# Patient Record
Sex: Female | Born: 1935 | Race: White | Hispanic: No | Smoking: Never smoker
Health system: Southern US, Community
[De-identification: ages and names within clinical notes are randomized; demographics above are authoritative.]

## PROBLEM LIST (undated history)

## (undated) DIAGNOSIS — E039 Hypothyroidism, unspecified: Secondary | ICD-10-CM

## (undated) DIAGNOSIS — F329 Major depressive disorder, single episode, unspecified: Secondary | ICD-10-CM

## (undated) DIAGNOSIS — I10 Essential (primary) hypertension: Secondary | ICD-10-CM

## (undated) DIAGNOSIS — F32A Depression, unspecified: Secondary | ICD-10-CM

## (undated) DIAGNOSIS — R001 Bradycardia, unspecified: Secondary | ICD-10-CM

## (undated) DIAGNOSIS — I4891 Unspecified atrial fibrillation: Secondary | ICD-10-CM

---
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
## 2004-11-15 ENCOUNTER — Ambulatory Visit: Payer: Self-pay | Admitting: Family Medicine

## 2005-09-17 ENCOUNTER — Ambulatory Visit: Payer: Self-pay | Admitting: Pain Medicine

## 2005-12-05 ENCOUNTER — Ambulatory Visit: Payer: Self-pay | Admitting: Pain Medicine

## 2006-01-02 ENCOUNTER — Ambulatory Visit: Payer: Self-pay | Admitting: Pain Medicine

## 2006-02-04 ENCOUNTER — Ambulatory Visit: Payer: Self-pay | Admitting: Physician Assistant

## 2006-02-12 ENCOUNTER — Ambulatory Visit: Payer: Self-pay | Admitting: Family Medicine

## 2006-02-19 ENCOUNTER — Ambulatory Visit: Payer: Self-pay | Admitting: Physician Assistant

## 2006-03-08 ENCOUNTER — Ambulatory Visit: Payer: Self-pay | Admitting: Physician Assistant

## 2006-04-08 ENCOUNTER — Ambulatory Visit: Payer: Self-pay | Admitting: Physician Assistant

## 2006-05-09 ENCOUNTER — Ambulatory Visit: Payer: Self-pay | Admitting: Physician Assistant

## 2006-06-06 ENCOUNTER — Ambulatory Visit: Payer: Self-pay | Admitting: Physician Assistant

## 2006-09-09 ENCOUNTER — Ambulatory Visit: Payer: Self-pay | Admitting: Pain Medicine

## 2006-10-08 ENCOUNTER — Ambulatory Visit: Payer: Self-pay | Admitting: Physician Assistant

## 2006-11-05 ENCOUNTER — Ambulatory Visit: Payer: Self-pay | Admitting: Physician Assistant

## 2006-12-05 ENCOUNTER — Ambulatory Visit: Payer: Self-pay | Admitting: Physician Assistant

## 2007-01-09 ENCOUNTER — Ambulatory Visit: Payer: Self-pay | Admitting: Physician Assistant

## 2007-02-06 ENCOUNTER — Ambulatory Visit: Payer: Self-pay | Admitting: Physician Assistant

## 2007-02-18 ENCOUNTER — Ambulatory Visit: Payer: Self-pay | Admitting: Family Medicine

## 2007-03-10 ENCOUNTER — Ambulatory Visit: Payer: Self-pay | Admitting: Physician Assistant

## 2007-04-08 ENCOUNTER — Ambulatory Visit: Payer: Self-pay | Admitting: Physician Assistant

## 2007-05-06 ENCOUNTER — Ambulatory Visit: Payer: Self-pay | Admitting: Physician Assistant

## 2007-06-04 ENCOUNTER — Ambulatory Visit: Payer: Self-pay | Admitting: Physician Assistant

## 2007-07-08 ENCOUNTER — Ambulatory Visit: Payer: Self-pay | Admitting: Physician Assistant

## 2007-08-13 ENCOUNTER — Ambulatory Visit: Payer: Self-pay | Admitting: Physician Assistant

## 2007-08-22 ENCOUNTER — Ambulatory Visit: Payer: Self-pay | Admitting: Family Medicine

## 2007-09-12 ENCOUNTER — Ambulatory Visit: Payer: Self-pay | Admitting: Physician Assistant

## 2007-10-16 ENCOUNTER — Ambulatory Visit: Payer: Self-pay | Admitting: Physician Assistant

## 2008-01-15 ENCOUNTER — Ambulatory Visit: Payer: Self-pay | Admitting: Physician Assistant

## 2008-02-19 ENCOUNTER — Ambulatory Visit: Payer: Self-pay | Admitting: Family Medicine

## 2008-03-11 ENCOUNTER — Ambulatory Visit: Payer: Self-pay | Admitting: Physician Assistant

## 2008-04-07 ENCOUNTER — Ambulatory Visit: Payer: Self-pay | Admitting: Pain Medicine

## 2008-04-12 ENCOUNTER — Ambulatory Visit: Payer: Self-pay | Admitting: Pain Medicine

## 2008-05-06 ENCOUNTER — Ambulatory Visit: Payer: Self-pay | Admitting: Physician Assistant

## 2008-06-23 ENCOUNTER — Ambulatory Visit: Payer: Self-pay | Admitting: Physician Assistant

## 2008-09-22 ENCOUNTER — Ambulatory Visit: Payer: Self-pay | Admitting: Physician Assistant

## 2008-11-17 ENCOUNTER — Ambulatory Visit: Payer: Self-pay | Admitting: Physician Assistant

## 2009-02-18 ENCOUNTER — Ambulatory Visit: Payer: Self-pay | Admitting: Pain Medicine

## 2009-02-22 ENCOUNTER — Ambulatory Visit: Payer: Self-pay | Admitting: Family Medicine

## 2009-03-28 ENCOUNTER — Ambulatory Visit: Payer: Self-pay | Admitting: Pain Medicine

## 2009-05-17 ENCOUNTER — Ambulatory Visit: Payer: Self-pay | Admitting: Physician Assistant

## 2009-07-15 ENCOUNTER — Encounter: Payer: Self-pay | Admitting: Neurology

## 2009-07-27 ENCOUNTER — Encounter: Payer: Self-pay | Admitting: Neurology

## 2009-08-16 ENCOUNTER — Ambulatory Visit: Payer: Self-pay | Admitting: Physician Assistant

## 2010-03-01 ENCOUNTER — Ambulatory Visit: Payer: Self-pay | Admitting: Family Medicine

## 2010-03-16 ENCOUNTER — Emergency Department: Payer: Self-pay | Admitting: Emergency Medicine

## 2010-04-12 ENCOUNTER — Ambulatory Visit: Payer: Self-pay | Admitting: Family Medicine

## 2010-09-26 ENCOUNTER — Emergency Department: Payer: Self-pay | Admitting: Emergency Medicine

## 2010-10-04 ENCOUNTER — Ambulatory Visit: Payer: Self-pay | Admitting: Ophthalmology

## 2010-12-13 ENCOUNTER — Ambulatory Visit: Payer: Self-pay | Admitting: Ophthalmology

## 2011-04-03 ENCOUNTER — Ambulatory Visit: Payer: Self-pay | Admitting: Family Medicine

## 2012-04-30 ENCOUNTER — Ambulatory Visit: Payer: Self-pay | Admitting: Internal Medicine

## 2012-08-12 IMAGING — CT CT HEAD WITHOUT CONTRAST
1 series · 16 of 30 positions shown, 20 images · non-contrast
Comparison: none

REASON FOR EXAM: new onset HA   poor sleep memory loss
COMMENTS:

[Series 2: soft tissue · axial · 0.38mm/px · z∈[-554,-418]mm · 16 of 30 slices shown, 20 images]
[im 2/30  brain]
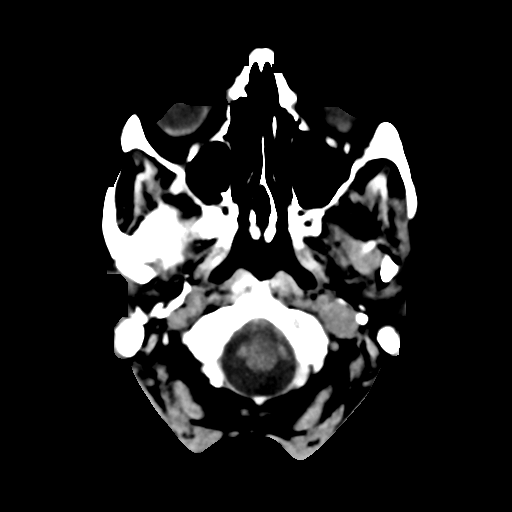
[im 2/30  bone]
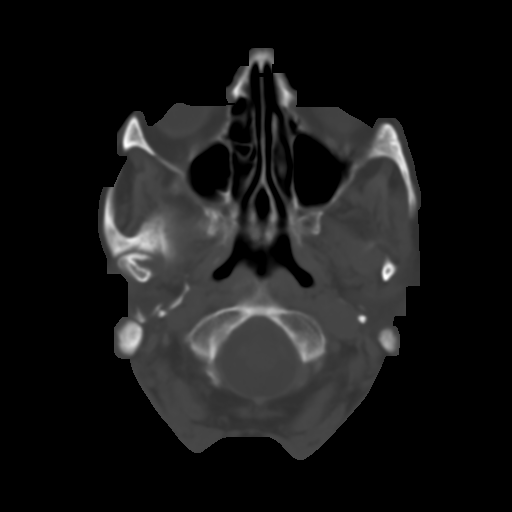
[im 4/30  brain]
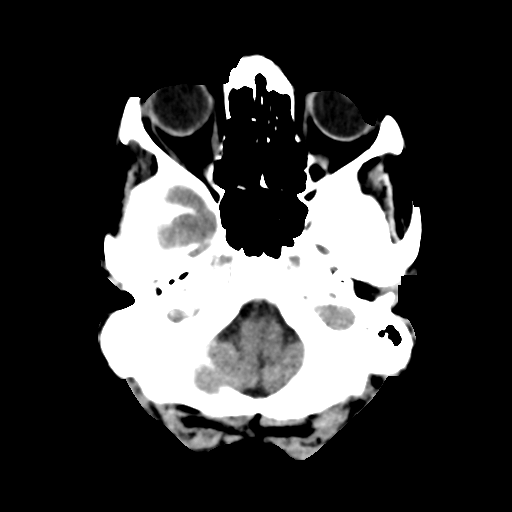
[im 6/30  brain]
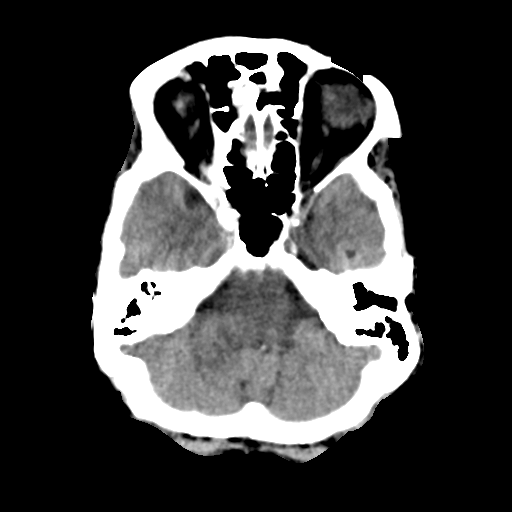
[im 8/30  brain]
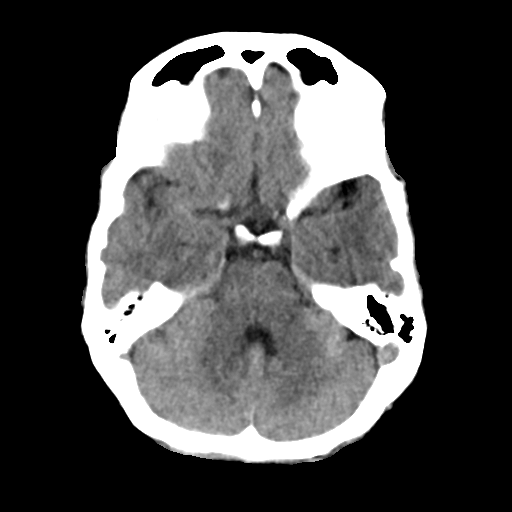
[im 9/30  brain]
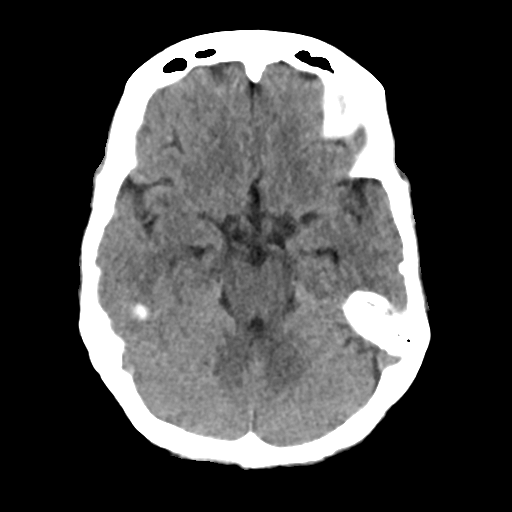
[im 9/30  bone]
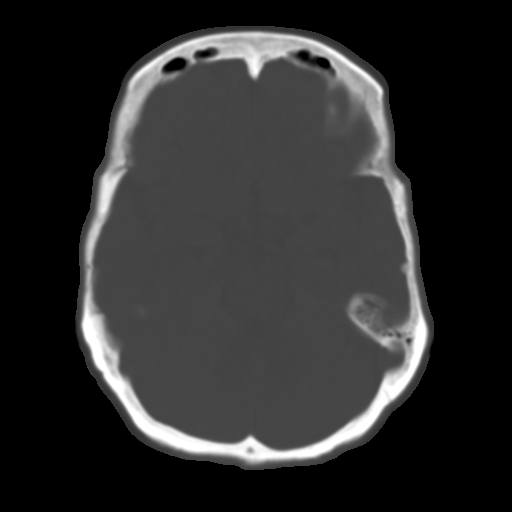
[im 11/30  brain]
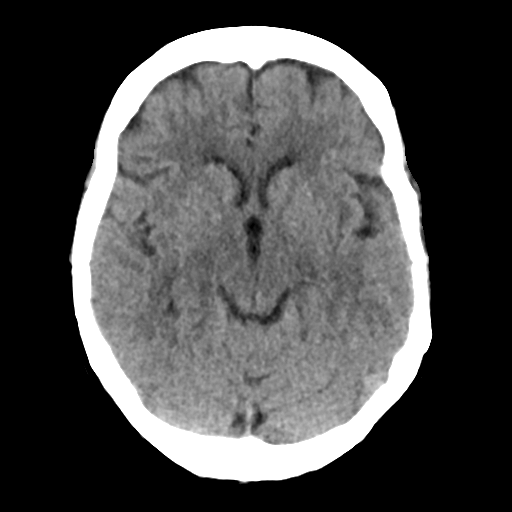
[im 13/30  brain]
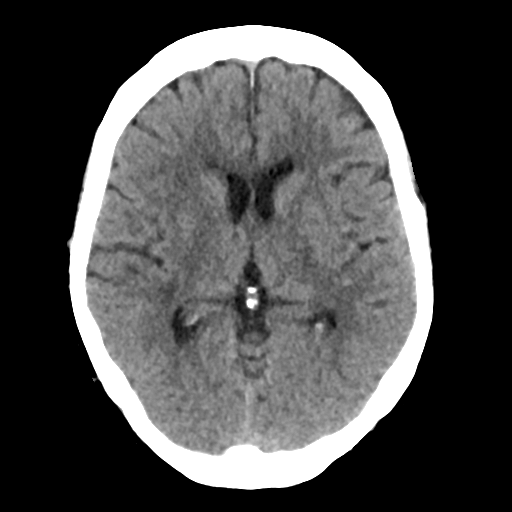
[im 15/30  brain]
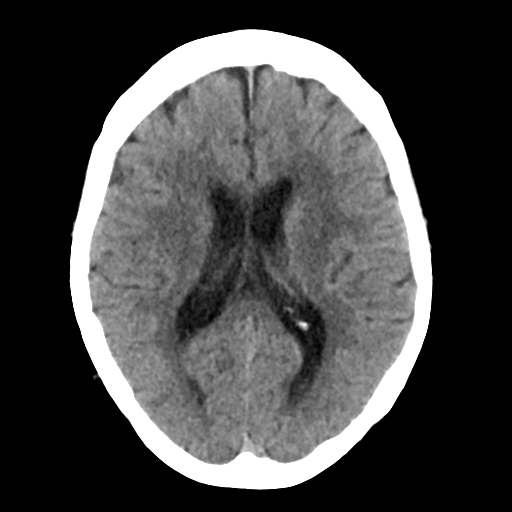
[im 16/30  brain]
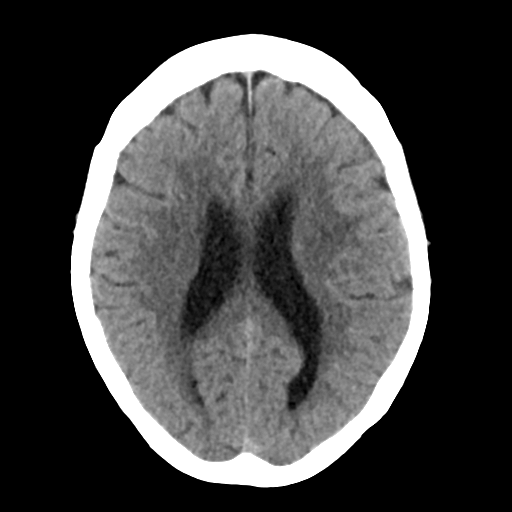
[im 16/30  bone]
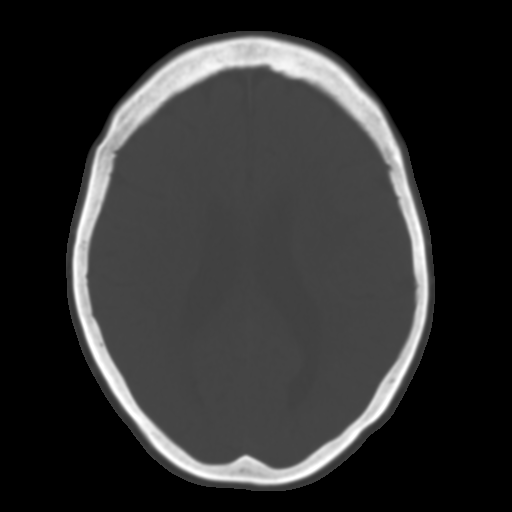
[im 18/30  brain]
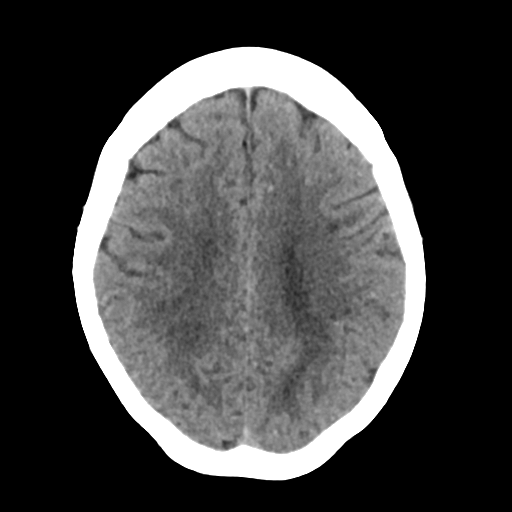
[im 20/30  brain]
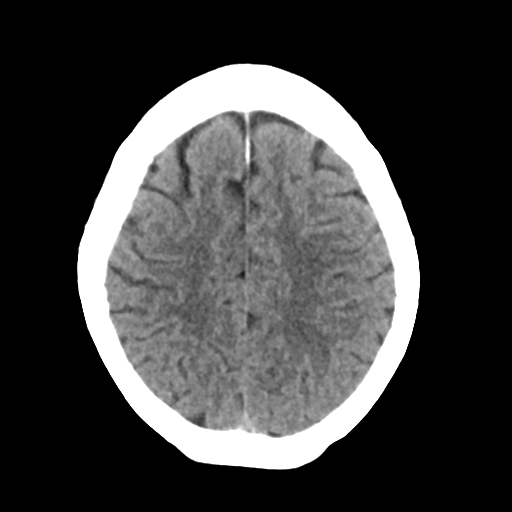
[im 22/30  brain]
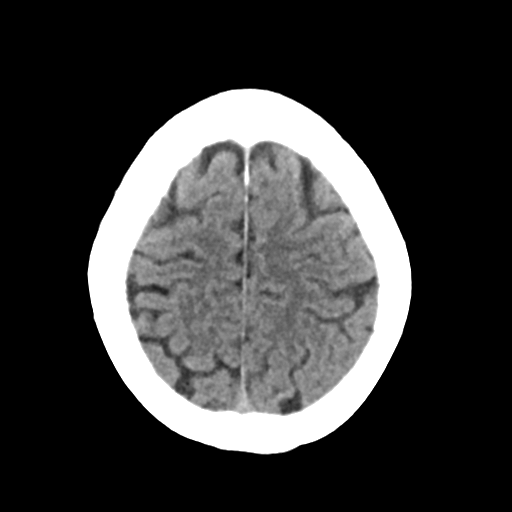
[im 23/30  brain]
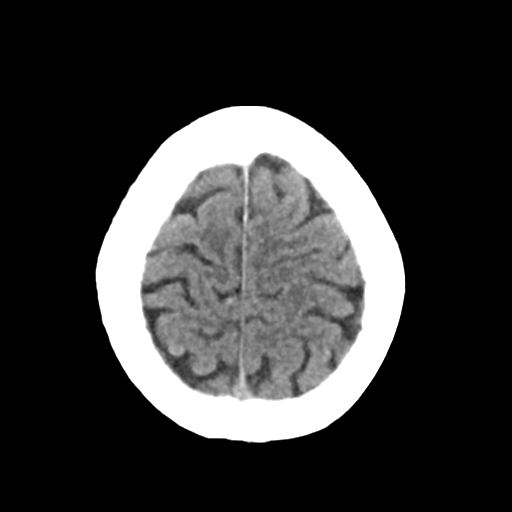
[im 23/30  bone]
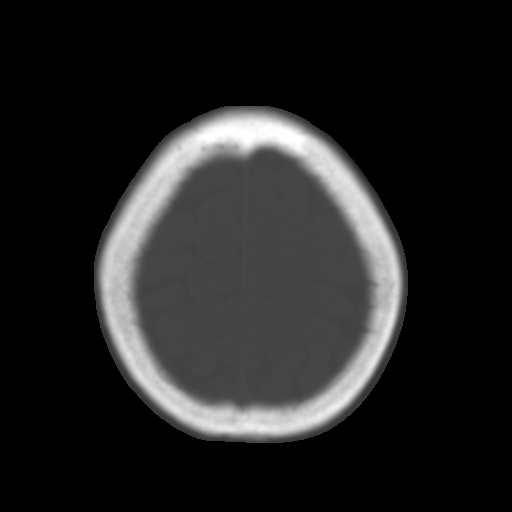
[im 25/30  brain]
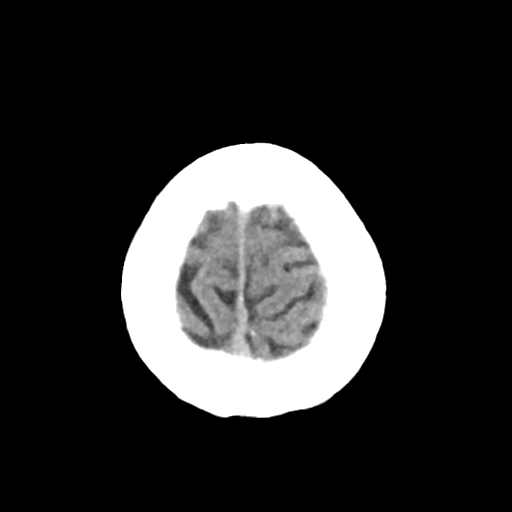
[im 27/30  brain]
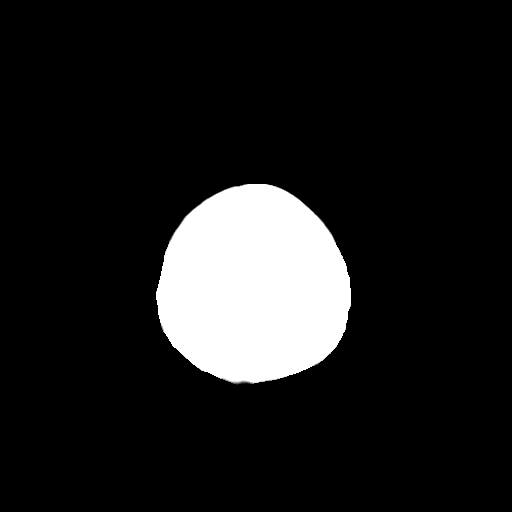
[im 29/30  brain]
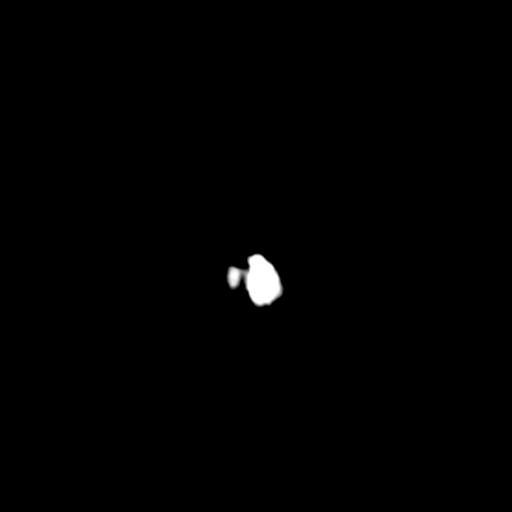

[16 of 30 positions shown; findings below may reference images not displayed]

PROCEDURE:     KCT - KCT HEAD WITHOUT CONTRAST  - April 12, 2010  [DATE]

RESULT:     Axial noncontrast CT scanning was performed through the brain at
5 mm intervals and slice thicknesses.

There are very mild age-appropriate atrophic changes diffusely. The
ventricles are normal in size and position. There is decreased density in
the deep white matter of both cerebral hemispheres consistent with chronic
small vessel ischemic type change. The cerebellum and brainstem are normal
in density. At bone window settings I do not see evidence of an acute skull
fracture. The observed portions of the paranasal sinuses and mastoid air
cells are clear.
IMPRESSION: I see no acute intracranial abnormality. If the patient's
symptoms persist and remain unexplained, followup MRI of the brain may be of
value.

## 2013-03-30 ENCOUNTER — Encounter: Payer: Self-pay | Admitting: Internal Medicine

## 2013-04-27 ENCOUNTER — Encounter: Payer: Self-pay | Admitting: Internal Medicine

## 2013-05-01 ENCOUNTER — Ambulatory Visit: Payer: Self-pay | Admitting: Internal Medicine

## 2015-09-29 ENCOUNTER — Other Ambulatory Visit: Payer: Self-pay | Admitting: Family

## 2015-09-29 DIAGNOSIS — Z0000 Encounter for general adult medical examination without abnormal findings: Secondary | ICD-10-CM

## 2015-11-02 ENCOUNTER — Ambulatory Visit: Admission: RE | Admit: 2015-11-02 | Payer: Self-pay | Source: Ambulatory Visit

## 2020-04-12 ENCOUNTER — Encounter: Payer: Self-pay | Admitting: Emergency Medicine

## 2020-04-12 ENCOUNTER — Other Ambulatory Visit: Payer: Self-pay

## 2020-04-12 ENCOUNTER — Emergency Department
Admission: EM | Admit: 2020-04-12 | Discharge: 2020-04-12 | Disposition: A | Discharge status: Home / Self Care | Payer: Medicare (Managed Care) | Attending: Emergency Medicine | Admitting: Emergency Medicine

## 2020-04-12 DIAGNOSIS — R310 Gross hematuria: Secondary | ICD-10-CM | POA: Diagnosis present

## 2020-04-12 DIAGNOSIS — E875 Hyperkalemia: Secondary | ICD-10-CM | POA: Diagnosis present

## 2020-04-12 DIAGNOSIS — K449 Diaphragmatic hernia without obstruction or gangrene: Secondary | ICD-10-CM | POA: Diagnosis present

## 2020-04-12 DIAGNOSIS — N132 Hydronephrosis with renal and ureteral calculous obstruction: Principal | ICD-10-CM | POA: Diagnosis present

## 2020-04-12 DIAGNOSIS — I160 Hypertensive urgency: Secondary | ICD-10-CM | POA: Diagnosis present

## 2020-04-12 DIAGNOSIS — N170 Acute kidney failure with tubular necrosis: Secondary | ICD-10-CM | POA: Diagnosis present

## 2020-04-12 DIAGNOSIS — D62 Acute posthemorrhagic anemia: Secondary | ICD-10-CM | POA: Diagnosis present

## 2020-04-12 DIAGNOSIS — Z66 Do not resuscitate: Secondary | ICD-10-CM | POA: Diagnosis present

## 2020-04-12 DIAGNOSIS — Z20822 Contact with and (suspected) exposure to covid-19: Secondary | ICD-10-CM | POA: Diagnosis present

## 2020-04-12 DIAGNOSIS — E876 Hypokalemia: Secondary | ICD-10-CM | POA: Diagnosis present

## 2020-04-12 DIAGNOSIS — R42 Dizziness and giddiness: Secondary | ICD-10-CM | POA: Insufficient documentation

## 2020-04-12 DIAGNOSIS — Z7901 Long term (current) use of anticoagulants: Secondary | ICD-10-CM

## 2020-04-12 DIAGNOSIS — Z515 Encounter for palliative care: Secondary | ICD-10-CM | POA: Diagnosis present

## 2020-04-12 DIAGNOSIS — N1330 Unspecified hydronephrosis: Secondary | ICD-10-CM | POA: Diagnosis not present

## 2020-04-12 DIAGNOSIS — Z5321 Procedure and treatment not carried out due to patient leaving prior to being seen by health care provider: Secondary | ICD-10-CM | POA: Insufficient documentation

## 2020-04-12 DIAGNOSIS — E039 Hypothyroidism, unspecified: Secondary | ICD-10-CM | POA: Diagnosis present

## 2020-04-12 DIAGNOSIS — C679 Malignant neoplasm of bladder, unspecified: Secondary | ICD-10-CM | POA: Diagnosis present

## 2020-04-12 DIAGNOSIS — I480 Paroxysmal atrial fibrillation: Secondary | ICD-10-CM | POA: Diagnosis present

## 2020-04-12 DIAGNOSIS — I10 Essential (primary) hypertension: Secondary | ICD-10-CM | POA: Diagnosis present

## 2020-04-12 DIAGNOSIS — E860 Dehydration: Secondary | ICD-10-CM | POA: Diagnosis present

## 2020-04-12 DIAGNOSIS — D6859 Other primary thrombophilia: Secondary | ICD-10-CM | POA: Diagnosis present

## 2020-04-12 HISTORY — DX: Unspecified atrial fibrillation: I48.91

## 2020-04-12 HISTORY — DX: Hypothyroidism, unspecified: E03.9

## 2020-04-12 HISTORY — DX: Depression, unspecified: F32.A

## 2020-04-12 HISTORY — DX: Bradycardia, unspecified: R00.1

## 2020-04-12 HISTORY — DX: Essential (primary) hypertension: I10

## 2020-04-12 LAB — BASIC METABOLIC PANEL
Anion gap: 14 (ref 5–15)
BUN: 52 mg/dL — ABNORMAL HIGH (ref 8–23)
CO2: 25 mmol/L (ref 22–32)
Calcium: 9.8 mg/dL (ref 8.9–10.3)
Chloride: 100 mmol/L (ref 98–111)
Creatinine, Ser: 4.81 mg/dL — ABNORMAL HIGH (ref 0.44–1.00)
GFR calc Af Amer: 9 mL/min — ABNORMAL LOW (ref >60)
GFR calc non Af Amer: 8 mL/min — ABNORMAL LOW (ref >60)
Glucose, Bld: 125 mg/dL — ABNORMAL HIGH (ref 70–99)
Potassium: 5.1 mmol/L (ref 3.5–5.1)
Sodium: 139 mmol/L (ref 135–145)

## 2020-04-12 LAB — CBC
HCT: 28.4 % — ABNORMAL LOW (ref 36.0–46.0)
Hemoglobin: 8.9 g/dL — ABNORMAL LOW (ref 12.0–15.0)
MCH: 33.1 pg (ref 26.0–34.0)
MCHC: 31.3 g/dL (ref 30.0–36.0)
MCV: 105.6 fL — ABNORMAL HIGH (ref 80.0–100.0)
Platelets: 305 10*3/uL (ref 150–400)
RBC: 2.69 MIL/uL — ABNORMAL LOW (ref 3.87–5.11)
RDW: 14.3 % (ref 11.5–15.5)
WBC: 8.9 10*3/uL (ref 4.0–10.5)
nRBC: 0 % (ref 0.0–0.2)

## 2020-04-12 NOTE — ED Notes (Signed)
Discussed with dr Corky Downs, no repeat labs

## 2020-04-12 NOTE — ED Triage Notes (Signed)
Pt returned with son. Per son they spoke with dr at [facility] and they told her to come back for blood transfusion.  hgb 8.9

## 2020-04-12 NOTE — ED Triage Notes (Signed)
Pt was here and left earlier.  [facility] doctor told pt she needs a blood transfusion which is why pt returned. Repeat labs not done.

## 2020-04-12 NOTE — ED Notes (Signed)
Pt leaving with son, does not want to wait.

## 2020-04-12 NOTE — ED Triage Notes (Signed)
[facility] arrives [facility] for reports of feeling dizzy. When asking [facility] employee is pt can tell me what is going on with her she states, "yes". When asked pt why she is here she states she was dizzy this morning. Pt in NAD, speech clear, skin warm and dry. Pt has pink MOST form

## 2020-04-13 ENCOUNTER — Inpatient Hospital Stay
Admission: EM | Admit: 2020-04-13 | Discharge: 2020-04-25 | DRG: 669 | Disposition: A | Discharge status: Skilled Nursing Facility | Payer: Medicare (Managed Care) | Source: Skilled Nursing Facility | Attending: Family Medicine | Admitting: Family Medicine

## 2020-04-13 ENCOUNTER — Emergency Department: Payer: Medicare (Managed Care)

## 2020-04-13 DIAGNOSIS — Z66 Do not resuscitate: Secondary | ICD-10-CM

## 2020-04-13 DIAGNOSIS — I4891 Unspecified atrial fibrillation: Secondary | ICD-10-CM | POA: Diagnosis not present

## 2020-04-13 DIAGNOSIS — N1330 Unspecified hydronephrosis: Secondary | ICD-10-CM | POA: Diagnosis present

## 2020-04-13 DIAGNOSIS — E860 Dehydration: Secondary | ICD-10-CM | POA: Diagnosis present

## 2020-04-13 DIAGNOSIS — E875 Hyperkalemia: Secondary | ICD-10-CM

## 2020-04-13 DIAGNOSIS — N3289 Other specified disorders of bladder: Secondary | ICD-10-CM

## 2020-04-13 DIAGNOSIS — N132 Hydronephrosis with renal and ureteral calculous obstruction: Secondary | ICD-10-CM | POA: Diagnosis present

## 2020-04-13 DIAGNOSIS — Z20822 Contact with and (suspected) exposure to covid-19: Secondary | ICD-10-CM | POA: Diagnosis present

## 2020-04-13 DIAGNOSIS — I160 Hypertensive urgency: Secondary | ICD-10-CM | POA: Diagnosis present

## 2020-04-13 DIAGNOSIS — D6859 Other primary thrombophilia: Secondary | ICD-10-CM | POA: Diagnosis present

## 2020-04-13 DIAGNOSIS — D494 Neoplasm of unspecified behavior of bladder: Secondary | ICD-10-CM | POA: Diagnosis not present

## 2020-04-13 DIAGNOSIS — Z515 Encounter for palliative care: Secondary | ICD-10-CM

## 2020-04-13 DIAGNOSIS — I480 Paroxysmal atrial fibrillation: Secondary | ICD-10-CM | POA: Diagnosis present

## 2020-04-13 DIAGNOSIS — Z7901 Long term (current) use of anticoagulants: Secondary | ICD-10-CM | POA: Diagnosis not present

## 2020-04-13 DIAGNOSIS — N170 Acute kidney failure with tubular necrosis: Secondary | ICD-10-CM | POA: Diagnosis present

## 2020-04-13 DIAGNOSIS — D62 Acute posthemorrhagic anemia: Secondary | ICD-10-CM

## 2020-04-13 DIAGNOSIS — K449 Diaphragmatic hernia without obstruction or gangrene: Secondary | ICD-10-CM | POA: Diagnosis present

## 2020-04-13 DIAGNOSIS — C679 Malignant neoplasm of bladder, unspecified: Secondary | ICD-10-CM | POA: Diagnosis present

## 2020-04-13 DIAGNOSIS — R319 Hematuria, unspecified: Secondary | ICD-10-CM

## 2020-04-13 DIAGNOSIS — E876 Hypokalemia: Secondary | ICD-10-CM | POA: Diagnosis present

## 2020-04-13 DIAGNOSIS — Z7189 Other specified counseling: Secondary | ICD-10-CM

## 2020-04-13 DIAGNOSIS — D6869 Other thrombophilia: Secondary | ICD-10-CM | POA: Diagnosis not present

## 2020-04-13 DIAGNOSIS — I10 Essential (primary) hypertension: Secondary | ICD-10-CM | POA: Diagnosis present

## 2020-04-13 DIAGNOSIS — N179 Acute kidney failure, unspecified: Secondary | ICD-10-CM | POA: Diagnosis not present

## 2020-04-13 DIAGNOSIS — E039 Hypothyroidism, unspecified: Secondary | ICD-10-CM | POA: Diagnosis present

## 2020-04-13 DIAGNOSIS — R310 Gross hematuria: Secondary | ICD-10-CM | POA: Diagnosis present

## 2020-04-13 DIAGNOSIS — K5900 Constipation, unspecified: Secondary | ICD-10-CM | POA: Insufficient documentation

## 2020-04-13 LAB — CBC WITH DIFFERENTIAL/PLATELET
Abs Immature Granulocytes: 0.03 10*3/uL (ref 0.00–0.07)
Basophils Absolute: 0.1 10*3/uL (ref 0.0–0.1)
Basophils Relative: 1 %
Eosinophils Absolute: 0.3 10*3/uL (ref 0.0–0.5)
Eosinophils Relative: 3 %
HCT: 27.5 % — ABNORMAL LOW (ref 36.0–46.0)
Hemoglobin: 9.1 g/dL — ABNORMAL LOW (ref 12.0–15.0)
Immature Granulocytes: 0 %
Lymphocytes Relative: 15 %
Lymphs Abs: 1.6 10*3/uL (ref 0.7–4.0)
MCH: 34 pg (ref 26.0–34.0)
MCHC: 33.1 g/dL (ref 30.0–36.0)
MCV: 102.6 fL — ABNORMAL HIGH (ref 80.0–100.0)
Monocytes Absolute: 0.9 10*3/uL (ref 0.1–1.0)
Monocytes Relative: 8 %
Neutro Abs: 8 10*3/uL — ABNORMAL HIGH (ref 1.7–7.7)
Neutrophils Relative %: 73 %
Platelets: 314 10*3/uL (ref 150–400)
RBC: 2.68 MIL/uL — ABNORMAL LOW (ref 3.87–5.11)
RDW: 14.3 % (ref 11.5–15.5)
WBC: 10.8 10*3/uL — ABNORMAL HIGH (ref 4.0–10.5)
nRBC: 0 % (ref 0.0–0.2)

## 2020-04-13 LAB — COMPREHENSIVE METABOLIC PANEL
ALT: 11 U/L (ref 0–44)
AST: 20 U/L (ref 15–41)
Albumin: 4.1 g/dL (ref 3.5–5.0)
Alkaline Phosphatase: 72 U/L (ref 38–126)
Anion gap: 14 (ref 5–15)
BUN: 52 mg/dL — ABNORMAL HIGH (ref 8–23)
CO2: 24 mmol/L (ref 22–32)
Calcium: 10.5 mg/dL — ABNORMAL HIGH (ref 8.9–10.3)
Chloride: 101 mmol/L (ref 98–111)
Creatinine, Ser: 4.47 mg/dL — ABNORMAL HIGH (ref 0.44–1.00)
GFR calc Af Amer: 10 mL/min — ABNORMAL LOW (ref >60)
GFR calc non Af Amer: 8 mL/min — ABNORMAL LOW (ref >60)
Glucose, Bld: 135 mg/dL — ABNORMAL HIGH (ref 70–99)
Potassium: 4.2 mmol/L (ref 3.5–5.1)
Sodium: 139 mmol/L (ref 135–145)
Total Bilirubin: 0.6 mg/dL (ref 0.3–1.2)
Total Protein: 7.3 g/dL (ref 6.5–8.1)

## 2020-04-13 LAB — URINALYSIS, COMPLETE (UACMP) WITH MICROSCOPIC
Bilirubin Urine: NEGATIVE
Glucose, UA: NEGATIVE mg/dL
Ketones, ur: NEGATIVE mg/dL
Nitrite: NEGATIVE
Protein, ur: 30 mg/dL — AB
Specific Gravity, Urine: 1.009 (ref 1.005–1.030)
pH: 6 (ref 5.0–8.0)

## 2020-04-13 LAB — VITAMIN D 25 HYDROXY (VIT D DEFICIENCY, FRACTURES): Vit D, 25-Hydroxy: 63.7 ng/mL (ref 30–100)

## 2020-04-13 LAB — TROPONIN I (HIGH SENSITIVITY)
Troponin I (High Sensitivity): 13 ng/L (ref <18)
Troponin I (High Sensitivity): 16 ng/L (ref <18)

## 2020-04-13 LAB — HEMOGLOBIN AND HEMATOCRIT, BLOOD
HCT: 24.7 % — ABNORMAL LOW (ref 36.0–46.0)
HCT: 28.5 % — ABNORMAL LOW (ref 36.0–46.0)
Hemoglobin: 7.7 g/dL — ABNORMAL LOW (ref 12.0–15.0)
Hemoglobin: 9.1 g/dL — ABNORMAL LOW (ref 12.0–15.0)

## 2020-04-13 LAB — SARS CORONAVIRUS 2 BY RT PCR (HOSPITAL ORDER, PERFORMED IN ~~LOC~~ HOSPITAL LAB): SARS Coronavirus 2: NEGATIVE

## 2020-04-13 MED ORDER — POLYVINYL ALCOHOL 1.4 % OP SOLN
1.0000 [drp] | Freq: Two times a day (BID) | OPHTHALMIC | Status: DC | PRN
  Filled 2020-04-13: qty 15

## 2020-04-13 MED ORDER — BISACODYL 10 MG RE SUPP
10.0000 mg | Freq: Every day | RECTAL | Status: DC
  Administered 2020-04-13: 10 mg via RECTAL
  Filled 2020-04-13 (×4): qty 1

## 2020-04-13 MED ORDER — METOPROLOL TARTRATE 5 MG/5ML IV SOLN
2.5000 mg | Freq: Four times a day (QID) | INTRAVENOUS | Status: DC | PRN
  Administered 2020-04-13: 2.5 mg via INTRAVENOUS
  Filled 2020-04-13: qty 5

## 2020-04-13 MED ORDER — TRAZODONE HCL 50 MG PO TABS
25.0000 mg | ORAL_TABLET | Freq: Every evening | ORAL | Status: DC | PRN
  Filled 2020-04-13: qty 0.5

## 2020-04-13 MED ORDER — HYDRALAZINE HCL 50 MG PO TABS
100.0000 mg | ORAL_TABLET | Freq: Three times a day (TID) | ORAL | Status: DC
  Administered 2020-04-13 – 2020-04-16 (×8): 100 mg via ORAL
  Filled 2020-04-13 (×9): qty 2

## 2020-04-13 MED ORDER — FLEET ENEMA 7-19 GM/118ML RE ENEM
1.0000 | ENEMA | Freq: Every day | RECTAL | Status: DC
  Administered 2020-04-15: 1 via RECTAL

## 2020-04-13 MED ORDER — ONDANSETRON HCL 4 MG PO TABS: 4.0000 mg | ORAL_TABLET | Freq: Four times a day (QID) | ORAL | Status: DC | PRN

## 2020-04-13 MED ORDER — ONDANSETRON HCL 4 MG/2ML IJ SOLN
4.0000 mg | Freq: Four times a day (QID) | INTRAMUSCULAR | Status: DC | PRN
  Administered 2020-04-13 – 2020-04-15 (×2): 4 mg via INTRAVENOUS
  Filled 2020-04-13 (×2): qty 2

## 2020-04-13 MED ORDER — CITALOPRAM HYDROBROMIDE 20 MG PO TABS
20.0000 mg | ORAL_TABLET | Freq: Every day | ORAL | Status: DC
  Administered 2020-04-13 – 2020-04-25 (×12): 20 mg via ORAL
  Filled 2020-04-13 (×13): qty 1

## 2020-04-13 MED ORDER — PANTOPRAZOLE SODIUM 40 MG PO TBEC
40.0000 mg | DELAYED_RELEASE_TABLET | Freq: Two times a day (BID) | ORAL | Status: DC
  Administered 2020-04-13 – 2020-04-25 (×23): 40 mg via ORAL
  Filled 2020-04-13 (×24): qty 1

## 2020-04-13 MED ORDER — SODIUM CHLORIDE 0.9 % IV BOLUS
500.0000 mL | Freq: Once | INTRAVENOUS | Status: AC
  Administered 2020-04-13: 500 mL via INTRAVENOUS

## 2020-04-13 MED ORDER — TRAZODONE HCL 100 MG PO TABS
100.0000 mg | ORAL_TABLET | Freq: Every day | ORAL | Status: DC
  Administered 2020-04-13 – 2020-04-24 (×12): 100 mg via ORAL
  Filled 2020-04-13 (×13): qty 1

## 2020-04-13 MED ORDER — SODIUM CHLORIDE 0.9 % IV SOLN
1.0000 g | INTRAVENOUS | Status: DC
  Administered 2020-04-13 – 2020-04-14 (×2): 1 g via INTRAVENOUS
  Filled 2020-04-13: qty 10
  Filled 2020-04-13: qty 1
  Filled 2020-04-13: qty 10

## 2020-04-13 MED ORDER — ACETAMINOPHEN 650 MG RE SUPP: 650.0000 mg | Freq: Four times a day (QID) | RECTAL | Status: DC | PRN

## 2020-04-13 MED ORDER — AMLODIPINE BESYLATE 5 MG PO TABS
5.0000 mg | ORAL_TABLET | Freq: Every day | ORAL | Status: DC
  Administered 2020-04-13 – 2020-04-16 (×3): 5 mg via ORAL
  Filled 2020-04-13 (×4): qty 1

## 2020-04-13 MED ORDER — SODIUM CHLORIDE 0.9 % IV SOLN: INTRAVENOUS | Status: DC

## 2020-04-13 MED ORDER — LABETALOL HCL 5 MG/ML IV SOLN: 20.0000 mg | INTRAVENOUS | Status: DC | PRN

## 2020-04-13 MED ORDER — TIMOLOL MALEATE 0.5 % OP SOLN
1.0000 [drp] | Freq: Every morning | OPHTHALMIC | Status: DC
  Administered 2020-04-16 – 2020-04-25 (×10): 1 [drp] via OPHTHALMIC
  Filled 2020-04-13 (×2): qty 5

## 2020-04-13 MED ORDER — MAGNESIUM HYDROXIDE 400 MG/5ML PO SUSP
30.0000 mL | Freq: Every day | ORAL | Status: DC | PRN
  Filled 2020-04-13: qty 30

## 2020-04-13 MED ORDER — ACETAMINOPHEN 325 MG PO TABS
650.0000 mg | ORAL_TABLET | Freq: Four times a day (QID) | ORAL | Status: DC | PRN
  Administered 2020-04-21: 650 mg via ORAL
  Filled 2020-04-13: qty 2

## 2020-04-13 MED ORDER — HYDRALAZINE HCL 20 MG/ML IJ SOLN
10.0000 mg | Freq: Once | INTRAMUSCULAR | Status: AC
  Administered 2020-04-13: 10 mg via INTRAVENOUS
  Filled 2020-04-13: qty 1

## 2020-04-13 MED ORDER — LIDOCAINE 5 % EX PTCH
1.0000 | MEDICATED_PATCH | CUTANEOUS | Status: DC
  Administered 2020-04-14 – 2020-04-23 (×10): 1 via TRANSDERMAL
  Filled 2020-04-13 (×13): qty 1

## 2020-04-13 MED ORDER — FAMOTIDINE 20 MG PO TABS
10.0000 mg | ORAL_TABLET | Freq: Every day | ORAL | Status: DC
  Administered 2020-04-13 – 2020-04-24 (×11): 10 mg via ORAL
  Filled 2020-04-13 (×11): qty 1

## 2020-04-13 MED ORDER — LACTULOSE 10 GM/15ML PO SOLN
30.0000 g | Freq: Every day | ORAL | Status: DC
  Administered 2020-04-13 – 2020-04-16 (×2): 30 g via ORAL
  Filled 2020-04-13 (×4): qty 60

## 2020-04-13 MED ORDER — LATANOPROST 0.005 % OP SOLN
1.0000 [drp] | Freq: Every day | OPHTHALMIC | Status: DC
  Administered 2020-04-15 – 2020-04-24 (×9): 1 [drp] via OPHTHALMIC
  Filled 2020-04-13 (×2): qty 2.5

## 2020-04-13 NOTE — ED Notes (Signed)
Pt in room with bed alarm going off attempted to get out of bed. Pt unable to safely get up on own and is reported to be unable to redirect. Engineer, building services, [place], [place],  at bedside at this time

## 2020-04-13 NOTE — ED Provider Notes (Signed)
[facility] Emergency Department Provider Note   ____________________________________________   First MD Initiated Contact with Patient 04/13/20 0201     (approximate)  I have reviewed the triage vital signs and the nursing notes.   HISTORY  Chief Complaint Abnormal Lab    HPI Christy Mccann is a 86 y.o. female sent [facility] the ED by [facility] for acute renal failure seen on her lab work.  Patient was started on Eliquis for atrial fibrillation several months ago.  Has had some issues with hematuria since.  On repeat blood work today, her BUN and creatinine were noted to be significantly elevated which is a change from normal.  Patient denies fever, cough, chest pain, shortness of breath, abdominal pain, nausea or vomiting.  Still makes urine daily.      Past Medical History:  Diagnosis Date  . A-fib ([place])   . Bradycardia   . Depression   . Hypertension   . Hypothyroid     Patient Active Problem List   Diagnosis Date Noted  . Acute kidney injury (AKI) with acute tubular necrosis (ATN) ([place]) 04/13/2020    History reviewed. No pertinent surgical history.  Prior to Admission medications   Medication Sig Start Date End Date Taking? Authorizing Provider  XARELTO 2.5 MG TABS tablet Take 5 mg by mouth daily. 02/20/20   [provider]    Allergies Gabapentin  History reviewed. No pertinent family history.  Social History Social History   Tobacco Use  . Smoking status: Not on file  Substance Use Topics  . Alcohol use: Not on file  . Drug use: Not on file    Review of Systems  Constitutional: No fever/chills Eyes: No visual changes. ENT: No sore throat. Cardiovascular: Denies chest pain. Respiratory: Denies shortness of breath. Gastrointestinal: No abdominal pain.  No nausea, no vomiting.  No diarrhea.  No constipation. Genitourinary: Positive for occasional hematuria.  Negative for dysuria. Musculoskeletal: Negative for back  pain. Skin: Negative for rash. Neurological: Negative for headaches, focal weakness or numbness.   ____________________________________________   PHYSICAL EXAM:  VITAL SIGNS: ED Triage Vitals  Enc Vitals Group     BP 04/12/20 1823 (!) 161/105     Pulse Rate 04/12/20 1823 (!) 107     Resp 04/12/20 1823 16     Temp 04/12/20 1823 98.9 F (37.2 C)     Temp Source 04/12/20 1823 Oral     SpO2 04/12/20 1823 100 %     Weight 04/12/20 1817 100 lb (45.4 kg)     Height --      Head Circumference --      Peak Flow --      Pain Score 04/12/20 1817 0     Pain Loc --      Pain Edu? --      Excl. in [place]? --     Constitutional: Alert and oriented.  Elderly appearing and in no acute distress. Eyes: Conjunctivae are normal. PERRL. EOMI. Head: Atraumatic. Nose: No congestion/rhinnorhea. Mouth/Throat: Mucous membranes are mildly dry.   Neck: No stridor.   Cardiovascular: Normal rate, regular rhythm. Grossly normal heart sounds.  Good peripheral circulation. Respiratory: Normal respiratory effort.  No retractions. Lungs CTAB. Gastrointestinal: Soft and nontender to light or deep palpation. No distention. No abdominal bruits. No CVA tenderness. Musculoskeletal: No lower extremity tenderness nor edema.  No joint effusions. Neurologic:  Normal speech and language. No gross focal neurologic deficits are appreciated.  Skin:  Skin is  warm, dry and intact. No rash noted. Psychiatric: Mood and affect are normal. Speech and behavior are normal.  ____________________________________________   LABS (all labs ordered are listed, but only abnormal results are displayed)  Labs Reviewed  CBC WITH DIFFERENTIAL/PLATELET - Abnormal; Notable for the following components:      Result Value   WBC 10.8 (*)    RBC 2.68 (*)    Hemoglobin 9.1 (*)    HCT 27.5 (*)    MCV 102.6 (*)    Neutro Abs 8.0 (*)    All other components within normal limits  COMPREHENSIVE METABOLIC PANEL - Abnormal; Notable for the  following components:   Glucose, Bld 135 (*)    BUN 52 (*)    Creatinine, Ser 4.47 (*)    Calcium 10.5 (*)    GFR calc non Af Amer 8 (*)    GFR calc Af Amer 10 (*)    All other components within normal limits  URINALYSIS, COMPLETE (UACMP) WITH MICROSCOPIC - Abnormal; Notable for the following components:   Color, Urine YELLOW (*)    APPearance CLEAR (*)    Hgb urine dipstick MODERATE (*)    Protein, ur 30 (*)    Leukocytes,Ua TRACE (*)    Bacteria, UA RARE (*)    All other components within normal limits  SARS CORONAVIRUS 2 BY RT PCR (HOSPITAL ORDER, [place] [facility])  URINE CULTURE  TROPONIN I (HIGH SENSITIVITY)  TROPONIN I (HIGH SENSITIVITY)   ____________________________________________  EKG  ED ECG REPORT I, Nussen Pullin J, the attending physician, personally viewed and interpreted this ECG.   Date: 04/13/2020  EKG Time: 1556  Rate: 115  Rhythm: atrial fibrillation, rate 115  Axis: Normal  Intervals:none  ST&T Change: Nonspecific  ____________________________________________  RADIOLOGY  ED MD interpretation: No acute cardiopulmonary process; large hiatal hernia; marked bilateral hydroureteronephrosis  Official radiology report(s): DG Chest Port 1 View  Result Date: 04/13/2020 CLINICAL DATA:  Acute renal failure EXAM: PORTABLE CHEST 1 VIEW COMPARISON:  Radiograph 10/28/2013 (report only) FINDINGS: Hyperinflation of the lungs, reportedly chronic. No focal consolidative opacity is seen. No convincing features of edema. No pneumothorax or visible effusion. Large hiatal hernia is present. Dense material along the right mediastinal border may reflect material within the esophagus or calcified hilar nodes. Remaining cardiomediastinal contours are unremarkable accounting for positioning secondary to marked levocurvature of the lumbar spine with severe multilevel discogenic changes and additional degenerative changes in the shoulders. IMPRESSION: 1. No acute  cardiopulmonary disease. 2. Large hiatal hernia. Density along the right mediastinal border may reflect material within the esophagus or calcified hilar nodes. 3. Hyperinflation, likely emphysematous change. Electronically Signed   By: Lovena Le M.D.   On: 04/13/2020 02:24   CT Renal Stone Study  Result Date: 04/13/2020 CLINICAL DATA:  Acute renal failure. EXAM: CT ABDOMEN AND PELVIS WITHOUT CONTRAST TECHNIQUE: Multidetector CT imaging of the abdomen and pelvis was performed following the standard protocol without IV contrast. COMPARISON:  None. FINDINGS: Lower chest: No consolidation or pleural fluid. Moderate to large hiatal hernia with greater than 50% of the stomach intrathoracic. Hepatobiliary: Focal hepatic abnormality on noncontrast exam. Gallbladder minimally distended. No calcified gallstone. No biliary dilatation. Pancreas: Mild parenchymal atrophy. No ductal dilatation or inflammation. Spleen: Normal in size without focal abnormality. Adrenals/Urinary Tract: No adrenal nodule. There is marked bilateral hydroureteronephrosis. Thinning of bilateral renal parenchyma. No significant perinephric edema. Both ureters are dilated to the urinary bladder insertion. Bladder is essentially empty with equivocal  circumferential wall thickening. No renal calculi. Stomach/Bowel: Moderately large hiatal hernia with greater than 50% of the stomach intrathoracic. No evidence of small bowel obstruction or inflammation. The appendix is not definitively visualized. Moderate volume of stool throughout the colon. There is colonic redundancy and tortuosity. Distal descending and sigmoid colon are nondistended, however there is stool distending the rectum with rectal distention of 6.6 cm. No rectal or colonic wall thickening. Vascular/Lymphatic: Aortic atherosclerosis and tortuosity. No aortic aneurysm. Limited assessment for adenopathy in the absence of contrast and paucity of intra-abdominal fat. No bulky enlarged  abdominopelvic lymph nodes. Reproductive: The uterus is not visualized, may be surgically absent or atrophic. No evidence of adnexal mass. Other: Few scattered calcifications in the pelvis may be phleboliths or calcified nodes. There is no ascites. No free air. Musculoskeletal: Degenerative change of the pubic symphysis. Scoliosis and multilevel degenerative change in the spine. IMPRESSION: 1. Marked bilateral hydroureteronephrosis. Both ureters are dilated to the urinary bladder insertion. There is thinning of the bilateral renal parenchyma. Lack of perinephric edema suggests this may be a chronic process. 2. Decompressed bladder with equivocal circumferential wall thickening. 3. Moderate to large hiatal hernia with greater than 50% of the stomach intrathoracic. 4. Moderate volume of stool throughout the colon with colonic redundancy and tortuosity, suggesting constipation. Rectal distention of 6.6 cm, recommend correlation for fecal impaction. Aortic Atherosclerosis (ICD10-I70.0). Electronically Signed   By: Keith Rake M.D.   On: 04/13/2020 03:42    ____________________________________________   PROCEDURES  Procedure(s) performed (including Critical Care):  Procedures   ____________________________________________   INITIAL IMPRESSION / ASSESSMENT AND PLAN / ED COURSE  As part of my medical decision making, I reviewed the following data within the [place] notes reviewed and incorporated, Labs reviewed, EKG interpreted, Old chart reviewed, Radiograph reviewed, Discussed with admitting physician and Notes from prior ED visits     KARYN BRULL was evaluated in Emergency Department on 04/13/2020 for the symptoms described in the history of present illness. She was evaluated in the context of the global COVID-19 pandemic, which necessitated consideration that the patient might be at risk for infection with the SARS-CoV-2 virus that causes COVID-19. Institutional  protocols and algorithms that pertain to the evaluation of patients at risk for COVID-19 are in a state of rapid change based on information released by regulatory bodies including the CDC and federal and state organizations. These policies and algorithms were followed during the patient's care in the ED.   86 [facility].  Patient has had intermittent hematuria since starting Eliquis for new onset atrial fibrillation.  Differential diagnosis includes but is not limited to dehydration, medication effect, infectious, metabolic etiologies, etc.  I have personally reviewed patient's records which were sent by her PCP.  Will obtain repeat lab work as patient will require admission, CT renal colic study.  Will begin gentle IV hydration, hydralazine for hypertension.   Clinical Course as of Apr 13 453  Wed Apr 13, 2020  0453 Updated patient of CT results and discuss with hospital services for admission. Trace leukocytes with 6-10 WBC on urinalysis; will add culture.   [JS]    Clinical Course User Index [JS] Paulette Blanch, MD     ____________________________________________   FINAL CLINICAL IMPRESSION(S) / ED DIAGNOSES  Final diagnoses:  Acute renal failure, unspecified acute renal failure type [facility])  Hydroureteronephrosis     ED Discharge Orders    None  Note:  This document was prepared using Dragon voice recognition software and may include unintentional dictation errors.   Paulette Blanch, MD 04/13/20 [phone]

## 2020-04-13 NOTE — Progress Notes (Signed)
Patient ID: Christy Mccann, female   DOB: 30-Mar-1931, 86 y.o.   MRN: [id number] [facility] PROGRESS NOTE  AMADI YOSHINO EPP:[id number] DOB: 03/27/1931 DOA: 04/13/2020 PCP: [place]  HPI/Subjective: Patient feeling okay.  She states that she had a bowel movement this morning.  She was sent in for abnormal labs.  CT scan showing bilateral hydronephrosis.  Patient states that she is urinating okay.  Patient states that she seen blood in the urine  Objective: Vitals:   04/13/20 0128 04/13/20 0700  BP: (!) 191/87 (!) 157/100  Pulse: 92 96  Resp: 18 16  Temp: 98.9 F (37.2 C)   SpO2: 98% 100%   No intake or output data in the 24 hours ending 04/13/20 1331 Filed Weights   04/12/20 1817  Weight: 45.4 kg    ROS: Review of Systems  Respiratory: Negative for shortness of breath.   Cardiovascular: Negative for chest pain.  Gastrointestinal: Negative for abdominal pain.  Genitourinary: Positive for hematuria.   Exam: Physical Exam HENT:     Head: Normocephalic.     Nose: No mucosal edema.     Mouth/Throat:     Pharynx: No oropharyngeal exudate.  Eyes:     General: Lids are normal.     Conjunctiva/sclera: Conjunctivae normal.     Pupils: Pupils are equal, round, and reactive to light.  Cardiovascular:     Rate and Rhythm: Normal rate and regular rhythm.     Heart sounds: Normal heart sounds, S1 normal and S2 normal.  Pulmonary:     Breath sounds: No decreased breath sounds, wheezing or rales.  Abdominal:     Palpations: Abdomen is soft.     Tenderness: There is no abdominal tenderness.  Musculoskeletal:     Right lower leg: No swelling.     Left lower leg: No swelling.  Skin:    General: Skin is warm.     Findings: No rash.  Neurological:     Mental Status: She is alert.     Cranial Nerves: Cranial nerves are intact.       Data Reviewed: Basic Metabolic Panel: Recent Labs  Lab 04/12/20 1608 04/13/20 0223  NA 139 139  K 5.1 4.2  CL 100 101   CO2 25 24  GLUCOSE 125* 135*  BUN 52* 52*  CREATININE 4.81* 4.47*  CALCIUM 9.8 10.5*   Liver Function Tests: Recent Labs  Lab 04/13/20 0223  AST 20  ALT 11  ALKPHOS 72  BILITOT 0.6  PROT 7.3  ALBUMIN 4.1   CBC: Recent Labs  Lab 04/12/20 1608 04/13/20 0223 04/13/20 0714  WBC 8.9 10.8*  --   NEUTROABS  --  8.0*  --   HGB 8.9* 9.1* 7.7*  HCT 28.4* 27.5* 24.7*  MCV 105.6* 102.6*  --   PLT 305 314  --      Recent Results (from the past 240 hour(s))  SARS Coronavirus 2 by RT PCR (hospital order, performed in [facility] hospital lab) Nasopharyngeal Nasopharyngeal Swab     Status: None   Collection Time: 04/13/20  2:23 AM   Specimen: Nasopharyngeal Swab  Result Value Ref Range Status   SARS Coronavirus 2 NEGATIVE NEGATIVE Final    Comment: (NOTE) SARS-CoV-2 target nucleic acids are NOT DETECTED.  The SARS-CoV-2 RNA is generally detectable in upper and lower respiratory specimens during the acute phase of infection. The lowest concentration of SARS-CoV-2 viral copies this assay can detect is 250 copies / mL. A negative  result does not preclude SARS-CoV-2 infection and should not be used as the sole basis for treatment or other patient management decisions.  A negative result may occur with improper specimen collection / handling, submission of specimen other than nasopharyngeal swab, presence of viral mutation(s) within the areas targeted by this assay, and inadequate number of viral copies (<250 copies / mL). A negative result must be combined with clinical observations, patient history, and epidemiological information.  Fact Sheet for Patients:   StrictlyIdeas.no  Fact Sheet for Healthcare Providers: BankingDealers.co.za  This test is not yet approved or  cleared by the [place] FDA and has been authorized for detection and/or diagnosis of SARS-CoV-2 by FDA under an Emergency Use Authorization (EUA).  This EUA  will remain in effect (meaning this test can be used) for the duration of the COVID-19 declaration under Section 564(b)(1) of the Act, 21 U.S.C. section 360bbb-3(b)(1), unless the authorization is terminated or revoked sooner.  Performed at [facility], [address] [place], [place] [address]      Studies: DG Chest [place] 1 View  Result Date: 04/13/2020 CLINICAL DATA:  Acute renal failure EXAM: PORTABLE CHEST 1 VIEW COMPARISON:  Radiograph 10/28/2013 (report only) FINDINGS: Hyperinflation of the lungs, reportedly chronic. No focal consolidative opacity is seen. No convincing features of edema. No pneumothorax or visible effusion. Large hiatal hernia is present. Dense material along the right mediastinal border may reflect material within the esophagus or calcified hilar nodes. Remaining cardiomediastinal contours are unremarkable accounting for positioning secondary to marked levocurvature of the lumbar spine with severe multilevel discogenic changes and additional degenerative changes in the shoulders. IMPRESSION: 1. No acute cardiopulmonary disease. 2. Large hiatal hernia. Density along the right mediastinal border may reflect material within the esophagus or calcified hilar nodes. 3. Hyperinflation, likely emphysematous change. Electronically Signed   By: Lovena Le M.D.   On: 04/13/2020 02:24   CT Renal Stone Study  Result Date: 04/13/2020 CLINICAL DATA:  Acute renal failure. EXAM: CT ABDOMEN AND PELVIS WITHOUT CONTRAST TECHNIQUE: Multidetector CT imaging of the abdomen and pelvis was performed following the standard protocol without IV contrast. COMPARISON:  None. FINDINGS: Lower chest: No consolidation or pleural fluid. Moderate to large hiatal hernia with greater than 50% of the stomach intrathoracic. Hepatobiliary: Focal hepatic abnormality on noncontrast exam. Gallbladder minimally distended. No calcified gallstone. No biliary dilatation. Pancreas: Mild parenchymal atrophy.  No ductal dilatation or inflammation. Spleen: Normal in size without focal abnormality. Adrenals/Urinary Tract: No adrenal nodule. There is marked bilateral hydroureteronephrosis. Thinning of bilateral renal parenchyma. No significant perinephric edema. Both ureters are dilated to the urinary bladder insertion. Bladder is essentially empty with equivocal circumferential wall thickening. No renal calculi. Stomach/Bowel: Moderately large hiatal hernia with greater than 50% of the stomach intrathoracic. No evidence of small bowel obstruction or inflammation. The appendix is not definitively visualized. Moderate volume of stool throughout the colon. There is colonic redundancy and tortuosity. Distal descending and sigmoid colon are nondistended, however there is stool distending the rectum with rectal distention of 6.6 cm. No rectal or colonic wall thickening. Vascular/Lymphatic: Aortic atherosclerosis and tortuosity. No aortic aneurysm. Limited assessment for adenopathy in the absence of contrast and paucity of intra-abdominal fat. No bulky enlarged abdominopelvic lymph nodes. Reproductive: The uterus is not visualized, may be surgically absent or atrophic. No evidence of adnexal mass. Other: Few scattered calcifications in the pelvis may be phleboliths or calcified nodes. There is no ascites. No free air. Musculoskeletal: Degenerative change of the  pubic symphysis. Scoliosis and multilevel degenerative change in the spine. IMPRESSION: 1. Marked bilateral hydroureteronephrosis. Both ureters are dilated to the urinary bladder insertion. There is thinning of the bilateral renal parenchyma. Lack of perinephric edema suggests this may be a chronic process. 2. Decompressed bladder with equivocal circumferential wall thickening. 3. Moderate to large hiatal hernia with greater than 50% of the stomach intrathoracic. 4. Moderate volume of stool throughout the colon with colonic redundancy and tortuosity, suggesting  constipation. Rectal distention of 6.6 cm, recommend correlation for fecal impaction. Aortic Atherosclerosis (ICD10-I70.0). Electronically Signed   By: Keith Rake M.D.   On: 04/13/2020 03:42    Scheduled Meds: . bisacodyl  10 mg Rectal Daily  . lactulose  30 g Oral Daily  . sodium phosphate  1 enema Rectal QHS   Continuous Infusions: . sodium chloride 30 mL/hr at 04/13/20 1249  . cefTRIAXone (ROCEPHIN)  IV      Assessment/Plan:  1. Acute kidney injury with bilateral hydronephrosis on CT scan.  Gentle IV fluid hydration.  Continue to monitor closely.  Case discussed with urology considering a cystoscopy with stenting tomorrow. 2. Acute blood loss anemia with hematuria.  Decrease rate of IV fluids.  Serial hemoglobins.  Transfuse if less than 7.  Benefits and risk of blood transfusion explained to patient.  Holding Xarelto. 3. Essential hypertension start Norvasc. 4. Urinalysis positive.  Continue IV fluids.  And Rocephin and follow-up urine culture. 5. Cognitive issues as per son.  Check a TSH, B12. 6. Slight hypercalcemia likely with dehydration and acute kidney injury.  Check a PTH.  Check a PTH related protein, vitamin D and serum protein electrophoresis. 7. Constipation seen on CT scan will give lactulose Dulcolax suppository and Fleet enema. 8. Large hiatal hernia seen on CT scan.  Will give Pepcid.        Code Status:     Code Status Orders  (From admission, onward)         Start     Ordered   04/13/20 0617  Full code  Continuous        04/13/20 0623        Code Status History    This patient has a current code status but no historical code status.   Advance Care Planning Activity     Family Communication: Spoke with son Almyra Free on the phone at [phone] Disposition Plan: Status is: Inpatient  Dispo: The patient is from: Home              Anticipated d/c is to: Home              Anticipated d/c date is: Likely will need at least 4 days here in the  hospital              Patient currently being treated for acute kidney injury and bilateral hydronephrosis and acute blood loss anemia  Consultants:  Urology  Antibiotics:  Rocephin  Time spent: 32 minutes  [place]

## 2020-04-13 NOTE — H&P (Signed)
[place]   PATIENT NAME: Christy Mccann    MR#:  [id number]  DATE OF BIRTH:  [birth year]  DATE OF ADMISSION:  04/13/2020  PRIMARY CARE PHYSICIAN: [place]   REQUESTING/REFERRING PHYSICIAN: Lurline Hare, MD  CHIEF COMPLAINT:   Chief Complaint  Patient presents with  . Abnormal Lab    HISTORY OF PRESENT ILLNESS:  Thora Scherman  is a 86 y.o. slim Caucasian female with a known history of atrial fibrillation, depression, hypertension and hypothyroidism, who presented to the emergency room with acute onset of hematuria which has been intermittent over the last week.  The patient has been on Eliquis for several months for her atrial fibrillation.  She has been having occasional mild fatigue and tiredness.  She denies any abdominal or flank pain.  No fever or chills.  She has been having headache without dizziness or blurred vision.  No chest pain or dyspnea or cough or wheezing or hemoptysis.  No other bleeding diathesis.  Upon presentation to the emergency room, blood pressure was 157/102 with a heart rate of 108 and otherwise normal vital signs.  Blood pressure later on was 169/122.  Labs revealed a BUN of 52 and creatinine 4.81 with a potassium of 5.1 yesterday and today BUN was 52 with a creatinine of 4.47 potassium 4.2 and CBC showed hemoglobin of 8.9 hematocrit 28.4 and today hemoglobin is 9.1 hematocrit 27.1 with macrocytosis on both occasions.  Urinalysis showed 6-10 WBCs and 11-20 RBCs with rare bacteria. EKG showed atrial fibrillation with rapid ventricular rate spines 115.  Portable chest ray showed no acute cardiopulmonary disease.  It showed large hiatal hernia and density along the right mediastinal border that may reflect material within the esophagus or calcified hilar lymph nodes.  It showed hyperinflation likely emphysematous changes.  CT renal stone revealed the following: 1. Marked bilateral hydroureteronephrosis. Both ureters are dilated to the urinary  bladder insertion. There is thinning of the bilateral renal parenchyma. Lack of perinephric edema suggests this may be a chronic process. 2. Decompressed bladder with equivocal circumferential wall thickening. 3. Moderate to large hiatal hernia with greater than 50% of the stomach intrathoracic. 4. Moderate volume of stool throughout the colon with colonic redundancy and tortuosity, suggesting constipation. Rectal distention of 6.6 cm, recommend correlation for fecal impaction.  Noncontrasted head CT scan revealed no acute intracranial normalities.  MRI of the lumbar revealed spondylosis with no other acute abnormalities.  The patient will be admitted to a progressive unit bed for further evaluation and management.  PAST MEDICAL HISTORY:   Past Medical History:  Diagnosis Date  . A-fib ([place])   . Bradycardia   . Depression   . Hypertension   . Hypothyroid     PAST SURGICAL HISTORY:  History reviewed. No pertinent surgical history.  She did not recall any previous surgeries  SOCIAL HISTORY:   Social History   Tobacco Use  . Smoking status: Not on file  Substance Use Topics  . Alcohol use: Not on file    FAMILY HISTORY:  History reviewed. No pertinent family history.  She does not recall familial diseases  DRUG ALLERGIES:   Allergies  Allergen Reactions  . Gabapentin Other (See Comments)    Unknown effects    REVIEW OF SYSTEMS:   ROS As per history of present illness. All pertinent systems were reviewed above. Constitutional, HEENT, cardiovascular, respiratory, GI, GU, musculoskeletal, neuro, psychiatric, endocrine, integumentary and hematologic systems were reviewed and are otherwise negative/unremarkable  except for positive findings mentioned above in the HPI.   MEDICATIONS AT HOME:   Prior to Admission medications   Medication Sig Start Date End Date Taking? Authorizing Provider  XARELTO 2.5 MG TABS tablet Take 5 mg by mouth daily. 02/20/20   [provider]      VITAL SIGNS:  Blood pressure (!) 191/87, pulse 92, temperature 98.9 F (37.2 C), temperature source Oral, resp. rate 18, weight 45.4 kg, SpO2 98 %.  PHYSICAL EXAMINATION:  Physical Exam  GENERAL:  86 y.o.-year-old Caucasian female patient lying in the bed with no acute distress.  EYES: Pupils equal, round, reactive to light and accommodation. No scleral icterus. Extraocular muscles intact.  HEENT: Head atraumatic, normocephalic. Oropharynx and nasopharynx clear.  NECK:  Supple, no jugular venous distention. No thyroid enlargement, no tenderness.  LUNGS: Normal breath sounds bilaterally, no wheezing, rales,rhonchi or crepitation. No use of accessory muscles of respiration.  CARDIOVASCULAR: Regular rate and rhythm, S1, S2 normal. No murmurs, rubs, or gallops.  ABDOMEN: Soft, nondistended, nontender. Bowel sounds present. No organomegaly or mass.  EXTREMITIES: No pedal edema, cyanosis, or clubbing.  NEUROLOGIC: Cranial nerves II through XII are intact. Muscle strength 5/5 in all extremities. Sensation intact. Gait not checked.  PSYCHIATRIC: The patient is alert and oriented x 3.  Normal affect and good eye contact. SKIN: No obvious rash, lesion, or ulcer.   LABORATORY PANEL:   CBC Recent Labs  Lab 04/13/20 0223  WBC 10.8*  HGB 9.1*  HCT 27.5*  PLT 314   ------------------------------------------------------------------------------------------------------------------  Chemistries  Recent Labs  Lab 04/13/20 0223  NA 139  K 4.2  CL 101  CO2 24  GLUCOSE 135*  BUN 52*  CREATININE 4.47*  CALCIUM 10.5*  AST 20  ALT 11  ALKPHOS 72  BILITOT 0.6   ------------------------------------------------------------------------------------------------------------------  Cardiac Enzymes No results for input(s): TROPONINI in the last 168  hours. ------------------------------------------------------------------------------------------------------------------  RADIOLOGY:  DG Chest Port 1 View  Result Date: 04/13/2020 CLINICAL DATA:  Acute renal failure EXAM: PORTABLE CHEST 1 VIEW COMPARISON:  Radiograph 10/28/2013 (report only) FINDINGS: Hyperinflation of the lungs, reportedly chronic. No focal consolidative opacity is seen. No convincing features of edema. No pneumothorax or visible effusion. Large hiatal hernia is present. Dense material along the right mediastinal border may reflect material within the esophagus or calcified hilar nodes. Remaining cardiomediastinal contours are unremarkable accounting for positioning secondary to marked levocurvature of the lumbar spine with severe multilevel discogenic changes and additional degenerative changes in the shoulders. IMPRESSION: 1. No acute cardiopulmonary disease. 2. Large hiatal hernia. Density along the right mediastinal border may reflect material within the esophagus or calcified hilar nodes. 3. Hyperinflation, likely emphysematous change. Electronically Signed   By: Lovena Le M.D.   On: 04/13/2020 02:24   CT Renal Stone Study  Result Date: 04/13/2020 CLINICAL DATA:  Acute renal failure. EXAM: CT ABDOMEN AND PELVIS WITHOUT CONTRAST TECHNIQUE: Multidetector CT imaging of the abdomen and pelvis was performed following the standard protocol without IV contrast. COMPARISON:  None. FINDINGS: Lower chest: No consolidation or pleural fluid. Moderate to large hiatal hernia with greater than 50% of the stomach intrathoracic. Hepatobiliary: Focal hepatic abnormality on noncontrast exam. Gallbladder minimally distended. No calcified gallstone. No biliary dilatation. Pancreas: Mild parenchymal atrophy. No ductal dilatation or inflammation. Spleen: Normal in size without focal abnormality. Adrenals/Urinary Tract: No adrenal nodule. There is marked bilateral hydroureteronephrosis. Thinning of  bilateral renal parenchyma. No significant perinephric edema. Both ureters are dilated to the urinary bladder  insertion. Bladder is essentially empty with equivocal circumferential wall thickening. No renal calculi. Stomach/Bowel: Moderately large hiatal hernia with greater than 50% of the stomach intrathoracic. No evidence of small bowel obstruction or inflammation. The appendix is not definitively visualized. Moderate volume of stool throughout the colon. There is colonic redundancy and tortuosity. Distal descending and sigmoid colon are nondistended, however there is stool distending the rectum with rectal distention of 6.6 cm. No rectal or colonic wall thickening. Vascular/Lymphatic: Aortic atherosclerosis and tortuosity. No aortic aneurysm. Limited assessment for adenopathy in the absence of contrast and paucity of intra-abdominal fat. No bulky enlarged abdominopelvic lymph nodes. Reproductive: The uterus is not visualized, may be surgically absent or atrophic. No evidence of adnexal mass. Other: Few scattered calcifications in the pelvis may be phleboliths or calcified nodes. There is no ascites. No free air. Musculoskeletal: Degenerative change of the pubic symphysis. Scoliosis and multilevel degenerative change in the spine. IMPRESSION: 1. Marked bilateral hydroureteronephrosis. Both ureters are dilated to the urinary bladder insertion. There is thinning of the bilateral renal parenchyma. Lack of perinephric edema suggests this may be a chronic process. 2. Decompressed bladder with equivocal circumferential wall thickening. 3. Moderate to large hiatal hernia with greater than 50% of the stomach intrathoracic. 4. Moderate volume of stool throughout the colon with colonic redundancy and tortuosity, suggesting constipation. Rectal distention of 6.6 cm, recommend correlation for fecal impaction. Aortic Atherosclerosis (ICD10-I70.0). Electronically Signed   By: Keith Rake M.D.   On: 04/13/2020 03:42       IMPRESSION AND PLAN:   1.  Acute kidney injury with associated hematuria and bilateral hydroureteronephrosis. -The patient will be admitted to a progressive unit bed. -We will hold off on Xarelto at this time. -We will place her on hydration with IV normal saline. -We will follow BMPs. -We will avoid nephrotoxins. -We will follow serial hemoglobins and hematocrits. -Urology consultation will be obtained. -I notified Dr. Bernardo Heater about the patient.  2.  Atrial fibrillation with rapid ventricular response. -This like secondary to #1. -She will be placed on as needed IV Lopressor.  3.  Hypertensive urgency. -We will place on as needed IV labetalol.  4.  DVT prophylaxis. -SCDs. -Medical prophylaxis currently contraindicated and Xarelto was held off given her hematuria.    All the records are reviewed and case discussed with ED provider. The plan of care was discussed in details with the patient (and family). I answered all questions. The patient agreed to proceed with the above mentioned plan. Further management will depend upon hospital course.   CODE STATUS: Full code  Status is: Inpatient  Remains inpatient appropriate because:Ongoing diagnostic testing needed not appropriate for outpatient work up, Unsafe d/c plan, IV treatments appropriate due to intensity of illness or inability to take PO and Inpatient level of care appropriate due to severity of illness   Dispo: The patient is from: Home              Anticipated d/c is to: Home              Anticipated d/c date is: 2 days              Patient currently is not medically stable to d/c.   TOTAL TIME TAKING CARE OF THIS PATIENT: 55 minutes.    Christel Mormon M.D on 04/13/2020 at 6:25 AM  [facility]   From 7 PM-7 AM, contact night-coverage [email or web address]  CC: Primary care physician; [place]   Note:  This dictation was prepared with Dragon dictation along with smaller phrase  technology. Any transcriptional typo errors that result from this process are unintentional.

## 2020-04-13 NOTE — H&P (View-Only) (Signed)
Urology Consult  Requesting physician: Dr. Sidney Ace  Reason for consultation: Bilateral hydronephrosis  Chief Complaint: Blood in urine  History of Present Illness: Christy Mccann is a 86 y.o. female followed by [facility] who was sent to the ED yesterday for significant creatinine elevation apparently above baseline.  Last baseline creatinine in epic was in 2017 and was 0.9.  Creatinine yesterday was 4.81 with a repeat creatinine earlier this morning 4.47.  Noncontrast CT of the abdomen pelvis showed severe bilateral hydronephrosis/hydroureter to the bladder.  The bladder was empty.  She had not been catheterized.  She relates to a 2 to 4-week history of intermittent gross hematuria.  She is on Xarelto for atrial fibrillation  She states she has been voiding without problems.  Denies prior history of urologic problems.  She denies flank, abdominal or pelvic pain.  No complaints this morning    Past Medical History:  Diagnosis Date   A-fib ([place])    Bradycardia    Depression    Hypertension    Hypothyroid     History reviewed. No pertinent surgical history.  Home Medications:  Current Meds  Medication Sig   acetaminophen (TYLENOL) 650 MG CR tablet Take 1,300 mg by mouth in the morning and at bedtime.   amLODipine (NORVASC) 5 MG tablet Take 5 mg by mouth daily.   bimatoprost (LUMIGAN) 0.01 % SOLN Place 1 drop into both eyes at bedtime.   calcium carbonate (TUMS - DOSED IN MG ELEMENTAL CALCIUM) 500 MG chewable tablet Chew 2 tablets by mouth 3 (three) times daily.   carboxymethylcellul-glycerin (OPTIVE) 0.5-0.9 % ophthalmic solution Place 1-2 drops into both eyes 2 (two) times daily as needed for dry eyes.   cholecalciferol (VITAMIN D3) 25 MCG (1000 UNIT) tablet Take 1,000 Units by mouth daily.   citalopram (CELEXA) 20 MG tablet Take 20 mg by mouth daily.   hydrALAZINE (APRESOLINE) 100 MG tablet Take 100 mg by mouth 3 (three) times daily.   lidocaine (LIDODERM) 5 % Place  1 patch onto the skin daily. Remove & Discard patch within 12 hours or as directed by MD   losartan (COZAAR) 100 MG tablet Take 100 mg by mouth daily.   meloxicam (MOBIC) 7.5 MG tablet Take 7.5 mg by mouth daily.   oxybutynin (DITROPAN-XL) 5 MG 24 hr tablet Take 5 mg by mouth at bedtime.   pantoprazole (PROTONIX) 40 MG tablet Take 40 mg by mouth 2 (two) times daily.   pregabalin (LYRICA) 75 MG capsule Take 75 mg by mouth at bedtime.   Timolol Maleate 0.5 % (DAILY) SOLN 1 gtt in each eye every morning for increased pressure   traZODone (DESYREL) 100 MG tablet Take 100 mg by mouth at bedtime.   XARELTO 2.5 MG TABS tablet Take 5 mg by mouth daily.    Allergies:  Allergies  Allergen Reactions   Gabapentin Other (See Comments)    Unknown effects    History reviewed. No pertinent family history.  Social History:  has no history on file for tobacco use, alcohol use, and drug use.  ROS: A complete review of systems was performed.  All systems are negative except for pertinent findings as noted.  Physical Exam:  Vital signs in last 24 hours: Temp:  [98.9 F (37.2 C)] 98.9 F (37.2 C) (08/18 0128) Pulse Rate:  [88-120] 120 (08/18 1500) Resp:  [16-20] 18 (08/18 1500) BP: (140-191)/(82-122) 157/96 (08/18 1500) SpO2:  [98 %-100 %] 99 % (08/18 1500) Weight:  [45.4  kg] 45.4 kg (08/17 1817) Constitutional:  Alert, No acute distress HEENT: [place] AT, moist mucus membranes.  Trachea midline, no masses Cardiovascular: Regular rate and rhythm, no clubbing, cyanosis, or edema. Respiratory: Normal respiratory effort, lungs clear bilaterally GI: Abdomen is soft, nontender, nondistended, no abdominal masses Neurologic: Grossly intact, no focal deficits, moving all 4 extremities    Laboratory Data:  Recent Labs    04/12/20 1608 04/13/20 0223 04/13/20 0714  WBC 8.9 10.8*  --   HGB 8.9* 9.1* 7.7*  HCT 28.4* 27.5* 24.7*   Recent Labs    04/12/20 1608 04/13/20 0223  NA 139 139  K  5.1 4.2  CL 100 101  CO2 25 24  GLUCOSE 125* 135*  BUN 52* 52*  CREATININE 4.81* 4.47*  CALCIUM 9.8 10.5*   No results for input(s): LABPT, INR in the last 72 hours. No results for input(s): LABURIN in the last 72 hours. Results for orders placed or performed during the hospital encounter of 04/13/20  SARS Coronavirus 2 by RT PCR (hospital order, performed in [facility] hospital lab) Nasopharyngeal Nasopharyngeal Swab     Status: None   Collection Time: 04/13/20  2:23 AM   Specimen: Nasopharyngeal Swab  Result Value Ref Range Status   SARS Coronavirus 2 NEGATIVE NEGATIVE Final    Comment: (NOTE) SARS-CoV-2 target nucleic acids are NOT DETECTED.  The SARS-CoV-2 RNA is generally detectable in upper and lower respiratory specimens during the acute phase of infection. The lowest concentration of SARS-CoV-2 viral copies this assay can detect is 250 copies / mL. A negative result does not preclude SARS-CoV-2 infection and should not be used as the sole basis for treatment or other patient management decisions.  A negative result may occur with improper specimen collection / handling, submission of specimen other than nasopharyngeal swab, presence of viral mutation(s) within the areas targeted by this assay, and inadequate number of viral copies (<250 copies / mL). A negative result must be combined with clinical observations, patient history, and epidemiological information.  Fact Sheet for Patients:   StrictlyIdeas.no  Fact Sheet for Healthcare Providers: BankingDealers.co.za  This test is not yet approved or  cleared by the [place] FDA and has been authorized for detection and/or diagnosis of SARS-CoV-2 by FDA under an Emergency Use Authorization (EUA).  This EUA will remain in effect (meaning this test can be used) for the duration of the COVID-19 declaration under Section 564(b)(1) of the Act, 21 U.S.C. section  360bbb-3(b)(1), unless the authorization is terminated or revoked sooner.  Performed at [facility], [address], [place], [place] [address]      Radiologic Imaging: CT was personally reviewed  DG Chest Port 1 View  Result Date: 04/13/2020 CLINICAL DATA:  Acute renal failure EXAM: PORTABLE CHEST 1 VIEW COMPARISON:  Radiograph 10/28/2013 (report only) FINDINGS: Hyperinflation of the lungs, reportedly chronic. No focal consolidative opacity is seen. No convincing features of edema. No pneumothorax or visible effusion. Large hiatal hernia is present. Dense material along the right mediastinal border may reflect material within the esophagus or calcified hilar nodes. Remaining cardiomediastinal contours are unremarkable accounting for positioning secondary to marked levocurvature of the lumbar spine with severe multilevel discogenic changes and additional degenerative changes in the shoulders. IMPRESSION: 1. No acute cardiopulmonary disease. 2. Large hiatal hernia. Density along the right mediastinal border may reflect material within the esophagus or calcified hilar nodes. 3. Hyperinflation, likely emphysematous change. Electronically Signed   By: Elwin Sleight.D.  On: 04/13/2020 02:24   CT Renal Stone Study  Result Date: 04/13/2020 CLINICAL DATA:  Acute renal failure. EXAM: CT ABDOMEN AND PELVIS WITHOUT CONTRAST TECHNIQUE: Multidetector CT imaging of the abdomen and pelvis was performed following the standard protocol without IV contrast. COMPARISON:  None. FINDINGS: Lower chest: No consolidation or pleural fluid. Moderate to large hiatal hernia with greater than 50% of the stomach intrathoracic. Hepatobiliary: Focal hepatic abnormality on noncontrast exam. Gallbladder minimally distended. No calcified gallstone. No biliary dilatation. Pancreas: Mild parenchymal atrophy. No ductal dilatation or inflammation. Spleen: Normal in size without focal abnormality. Adrenals/Urinary Tract: No  adrenal nodule. There is marked bilateral hydroureteronephrosis. Thinning of bilateral renal parenchyma. No significant perinephric edema. Both ureters are dilated to the urinary bladder insertion. Bladder is essentially empty with equivocal circumferential wall thickening. No renal calculi. Stomach/Bowel: Moderately large hiatal hernia with greater than 50% of the stomach intrathoracic. No evidence of small bowel obstruction or inflammation. The appendix is not definitively visualized. Moderate volume of stool throughout the colon. There is colonic redundancy and tortuosity. Distal descending and sigmoid colon are nondistended, however there is stool distending the rectum with rectal distention of 6.6 cm. No rectal or colonic wall thickening. Vascular/Lymphatic: Aortic atherosclerosis and tortuosity. No aortic aneurysm. Limited assessment for adenopathy in the absence of contrast and paucity of intra-abdominal fat. No bulky enlarged abdominopelvic lymph nodes. Reproductive: The uterus is not visualized, may be surgically absent or atrophic. No evidence of adnexal mass. Other: Few scattered calcifications in the pelvis may be phleboliths or calcified nodes. There is no ascites. No free air. Musculoskeletal: Degenerative change of the pubic symphysis. Scoliosis and multilevel degenerative change in the spine. IMPRESSION: 1. Marked bilateral hydroureteronephrosis. Both ureters are dilated to the urinary bladder insertion. There is thinning of the bilateral renal parenchyma. Lack of perinephric edema suggests this may be a chronic process. 2. Decompressed bladder with equivocal circumferential wall thickening. 3. Moderate to large hiatal hernia with greater than 50% of the stomach intrathoracic. 4. Moderate volume of stool throughout the colon with colonic redundancy and tortuosity, suggesting constipation. Rectal distention of 6.6 cm, recommend correlation for fecal impaction. Aortic Atherosclerosis (ICD10-I70.0).  Electronically Signed   By: Keith Rake M.D.   On: 04/13/2020 03:42    Impression/Assessment:   1.  Bilateral hydronephrosis with acute kidney injury  Severe bilateral hydronephrosis with bilateral parenchymal thinning which is consistent with a chronic process.  I have requested any previous creatinine results from [facility]  2.  Gross hematuria  Marked bladder wall thickening on CT  Recommendation:   Cystoscopy with bilateral ureteral stent placement.  The procedure was discussed in detail including potential risks of bleeding, infection/sepsis and ureteral injury.  The possibility of unsuccessful stent placement due to anatomy, stricture or nonvisualization of the ureteral orifices was discussed and in that event would require percutaneous nephrostomy placement.  A possible need for bladder biopsy and TURBT were both discussed.  She indicated all questions were answered and desires to proceed.   John Giovanni, MD   04/13/2020, 4:05 PM  John Giovanni,  MD

## 2020-04-13 NOTE — ED Notes (Signed)
Pt actively vomiting in room with safety sitter, PRN meds to be admin

## 2020-04-13 NOTE — TOC Initial Note (Signed)
Transition of Care [facility]) - Initial/Assessment Note    Patient Details  Name: RANELL FINELLI MRN: [id number] Date of Birth: 08/13/1931  Transition of Care [facility]) CM/SW Contact:    Ova Freshwater Phone Number: [phone] 04/13/2020, 2:00 PM  Clinical Narrative:                  CSW spoke with Denisha RN from [place] [phone] for an update.       Patient Goals and CMS Choice        Expected Discharge Plan and Services                                                Prior Living Arrangements/Services                       Activities of Daily Living      Permission Sought/Granted                  Emotional Assessment              Admission diagnosis:  Acute kidney injury (AKI) with acute tubular necrosis (ATN) ([place]) [N17.0] Patient Active Problem List   Diagnosis Date Noted  . Acute kidney injury (AKI) with acute tubular necrosis (ATN) ([facility]) 04/13/2020  . AKI (acute kidney injury) ([place])   . Hydronephrosis   . Acute blood loss anemia   . Hematuria   . Constipation   . Hypercalcemia   . Hiatal hernia    PCP:  [place]:  No Pharmacies Listed    Social Determinants of Health (SDOH) Interventions    Readmission Risk Interventions No flowsheet data found.

## 2020-04-13 NOTE — ED Notes (Signed)
Pt resting in bed, lights off and sitter at bedside for safety

## 2020-04-13 NOTE — ED Notes (Signed)
Pt ambulated to toilet in room with assistance from Probation officer. Pt is now back in bed on monitor with no complaints at this time. Call bell is in reach and pt was instructed to press call bell is staff assistance is needed again.

## 2020-04-13 NOTE — ED Notes (Signed)
Pt transported to CT ?

## 2020-04-13 NOTE — Consult Note (Signed)
Urology Consult  Requesting physician: Dr. Sidney Ace  Reason for consultation: Bilateral hydronephrosis  Chief Complaint: Blood in urine  History of Present Illness: Christy Mccann is a 86 y.o. female followed by [facility] who was sent to the ED yesterday for significant creatinine elevation apparently above baseline.  Last baseline creatinine in epic was in 2017 and was 0.9.  Creatinine yesterday was 4.81 with a repeat creatinine earlier this morning 4.47.  Noncontrast CT of the abdomen pelvis showed severe bilateral hydronephrosis/hydroureter to the bladder.  The bladder was empty.  She had not been catheterized.  She relates to a 2 to 4-week history of intermittent gross hematuria.  She is on Xarelto for atrial fibrillation  She states she has been voiding without problems.  Denies prior history of urologic problems.  She denies flank, abdominal or pelvic pain.  No complaints this morning    Past Medical History:  Diagnosis Date  . A-fib ([place])   . Bradycardia   . Depression   . Hypertension   . Hypothyroid     History reviewed. No pertinent surgical history.  Home Medications:  Current Meds  Medication Sig  . acetaminophen (TYLENOL) 650 MG CR tablet Take 1,300 mg by mouth in the morning and at bedtime.  Marland Kitchen amLODipine (NORVASC) 5 MG tablet Take 5 mg by mouth daily.  . bimatoprost (LUMIGAN) 0.01 % SOLN Place 1 drop into both eyes at bedtime.  . calcium carbonate (TUMS - DOSED IN MG ELEMENTAL CALCIUM) 500 MG chewable tablet Chew 2 tablets by mouth 3 (three) times daily.  . carboxymethylcellul-glycerin (OPTIVE) 0.5-0.9 % ophthalmic solution Place 1-2 drops into both eyes 2 (two) times daily as needed for dry eyes.  . cholecalciferol (VITAMIN D3) 25 MCG (1000 UNIT) tablet Take 1,000 Units by mouth daily.  . citalopram (CELEXA) 20 MG tablet Take 20 mg by mouth daily.  . hydrALAZINE (APRESOLINE) 100 MG tablet Take 100 mg by mouth 3 (three) times daily.  Marland Kitchen lidocaine (LIDODERM) 5 % Place  1 patch onto the skin daily. Remove & Discard patch within 12 hours or as directed by MD  . losartan (COZAAR) 100 MG tablet Take 100 mg by mouth daily.  . meloxicam (MOBIC) 7.5 MG tablet Take 7.5 mg by mouth daily.  Marland Kitchen oxybutynin (DITROPAN-XL) 5 MG 24 hr tablet Take 5 mg by mouth at bedtime.  . pantoprazole (PROTONIX) 40 MG tablet Take 40 mg by mouth 2 (two) times daily.  . pregabalin (LYRICA) 75 MG capsule Take 75 mg by mouth at bedtime.  . Timolol Maleate 0.5 % (DAILY) SOLN 1 gtt in each eye every morning for increased pressure  . traZODone (DESYREL) 100 MG tablet Take 100 mg by mouth at bedtime.  Alveda Reasons 2.5 MG TABS tablet Take 5 mg by mouth daily.    Allergies:  Allergies  Allergen Reactions  . Gabapentin Other (See Comments)    Unknown effects    History reviewed. No pertinent family history.  Social History:  has no history on file for tobacco use, alcohol use, and drug use.  ROS: A complete review of systems was performed.  All systems are negative except for pertinent findings as noted.  Physical Exam:  Vital signs in last 24 hours: Temp:  [98.9 F (37.2 C)] 98.9 F (37.2 C) (08/18 0128) Pulse Rate:  [88-120] 120 (08/18 1500) Resp:  [16-20] 18 (08/18 1500) BP: (140-191)/(82-122) 157/96 (08/18 1500) SpO2:  [98 %-100 %] 99 % (08/18 1500) Weight:  [45.4  kg] 45.4 kg (08/17 1817) Constitutional:  Alert, No acute distress HEENT: [place] AT, moist mucus membranes.  Trachea midline, no masses Cardiovascular: Regular rate and rhythm, no clubbing, cyanosis, or edema. Respiratory: Normal respiratory effort, lungs clear bilaterally GI: Abdomen is soft, nontender, nondistended, no abdominal masses Neurologic: Grossly intact, no focal deficits, moving all 4 extremities    Laboratory Data:  Recent Labs    04/12/20 1608 04/13/20 0223 04/13/20 0714  WBC 8.9 10.8*  --   HGB 8.9* 9.1* 7.7*  HCT 28.4* 27.5* 24.7*   Recent Labs    04/12/20 1608 04/13/20 0223  NA 139 139  K  5.1 4.2  CL 100 101  CO2 25 24  GLUCOSE 125* 135*  BUN 52* 52*  CREATININE 4.81* 4.47*  CALCIUM 9.8 10.5*   No results for input(s): LABPT, INR in the last 72 hours. No results for input(s): LABURIN in the last 72 hours. Results for orders placed or performed during the hospital encounter of 04/13/20  SARS Coronavirus 2 by RT PCR (hospital order, performed in [facility] hospital lab) Nasopharyngeal Nasopharyngeal Swab     Status: None   Collection Time: 04/13/20  2:23 AM   Specimen: Nasopharyngeal Swab  Result Value Ref Range Status   SARS Coronavirus 2 NEGATIVE NEGATIVE Final    Comment: (NOTE) SARS-CoV-2 target nucleic acids are NOT DETECTED.  The SARS-CoV-2 RNA is generally detectable in upper and lower respiratory specimens during the acute phase of infection. The lowest concentration of SARS-CoV-2 viral copies this assay can detect is 250 copies / mL. A negative result does not preclude SARS-CoV-2 infection and should not be used as the sole basis for treatment or other patient management decisions.  A negative result may occur with improper specimen collection / handling, submission of specimen other than nasopharyngeal swab, presence of viral mutation(s) within the areas targeted by this assay, and inadequate number of viral copies (<250 copies / mL). A negative result must be combined with clinical observations, patient history, and epidemiological information.  Fact Sheet for Patients:   StrictlyIdeas.no  Fact Sheet for Healthcare Providers: BankingDealers.co.za  This test is not yet approved or  cleared by the [place] FDA and has been authorized for detection and/or diagnosis of SARS-CoV-2 by FDA under an Emergency Use Authorization (EUA).  This EUA will remain in effect (meaning this test can be used) for the duration of the COVID-19 declaration under Section 564(b)(1) of the Act, 21 U.S.C. section  360bbb-3(b)(1), unless the authorization is terminated or revoked sooner.  Performed at [facility], [address] [place], [place] [address]      Radiologic Imaging: CT was personally reviewed  DG Chest Port 1 View  Result Date: 04/13/2020 CLINICAL DATA:  Acute renal failure EXAM: PORTABLE CHEST 1 VIEW COMPARISON:  Radiograph 10/28/2013 (report only) FINDINGS: Hyperinflation of the lungs, reportedly chronic. No focal consolidative opacity is seen. No convincing features of edema. No pneumothorax or visible effusion. Large hiatal hernia is present. Dense material along the right mediastinal border may reflect material within the esophagus or calcified hilar nodes. Remaining cardiomediastinal contours are unremarkable accounting for positioning secondary to marked levocurvature of the lumbar spine with severe multilevel discogenic changes and additional degenerative changes in the shoulders. IMPRESSION: 1. No acute cardiopulmonary disease. 2. Large hiatal hernia. Density along the right mediastinal border may reflect material within the esophagus or calcified hilar nodes. 3. Hyperinflation, likely emphysematous change. Electronically Signed   By: Elwin Sleight.D.  On: 04/13/2020 02:24   CT Renal Stone Study  Result Date: 04/13/2020 CLINICAL DATA:  Acute renal failure. EXAM: CT ABDOMEN AND PELVIS WITHOUT CONTRAST TECHNIQUE: Multidetector CT imaging of the abdomen and pelvis was performed following the standard protocol without IV contrast. COMPARISON:  None. FINDINGS: Lower chest: No consolidation or pleural fluid. Moderate to large hiatal hernia with greater than 50% of the stomach intrathoracic. Hepatobiliary: Focal hepatic abnormality on noncontrast exam. Gallbladder minimally distended. No calcified gallstone. No biliary dilatation. Pancreas: Mild parenchymal atrophy. No ductal dilatation or inflammation. Spleen: Normal in size without focal abnormality. Adrenals/Urinary Tract: No  adrenal nodule. There is marked bilateral hydroureteronephrosis. Thinning of bilateral renal parenchyma. No significant perinephric edema. Both ureters are dilated to the urinary bladder insertion. Bladder is essentially empty with equivocal circumferential wall thickening. No renal calculi. Stomach/Bowel: Moderately large hiatal hernia with greater than 50% of the stomach intrathoracic. No evidence of small bowel obstruction or inflammation. The appendix is not definitively visualized. Moderate volume of stool throughout the colon. There is colonic redundancy and tortuosity. Distal descending and sigmoid colon are nondistended, however there is stool distending the rectum with rectal distention of 6.6 cm. No rectal or colonic wall thickening. Vascular/Lymphatic: Aortic atherosclerosis and tortuosity. No aortic aneurysm. Limited assessment for adenopathy in the absence of contrast and paucity of intra-abdominal fat. No bulky enlarged abdominopelvic lymph nodes. Reproductive: The uterus is not visualized, may be surgically absent or atrophic. No evidence of adnexal mass. Other: Few scattered calcifications in the pelvis may be phleboliths or calcified nodes. There is no ascites. No free air. Musculoskeletal: Degenerative change of the pubic symphysis. Scoliosis and multilevel degenerative change in the spine. IMPRESSION: 1. Marked bilateral hydroureteronephrosis. Both ureters are dilated to the urinary bladder insertion. There is thinning of the bilateral renal parenchyma. Lack of perinephric edema suggests this may be a chronic process. 2. Decompressed bladder with equivocal circumferential wall thickening. 3. Moderate to large hiatal hernia with greater than 50% of the stomach intrathoracic. 4. Moderate volume of stool throughout the colon with colonic redundancy and tortuosity, suggesting constipation. Rectal distention of 6.6 cm, recommend correlation for fecal impaction. Aortic Atherosclerosis (ICD10-I70.0).  Electronically Signed   By: Keith Rake M.D.   On: 04/13/2020 03:42    Impression/Assessment:   1.  Bilateral hydronephrosis with acute kidney injury  Severe bilateral hydronephrosis with bilateral parenchymal thinning which is consistent with a chronic process.  I have requested any previous creatinine results from [facility]  2.  Gross hematuria  Marked bladder wall thickening on CT  Recommendation:   Cystoscopy with bilateral ureteral stent placement.  The procedure was discussed in detail including potential risks of bleeding, infection/sepsis and ureteral injury.  The possibility of unsuccessful stent placement due to anatomy, stricture or nonvisualization of the ureteral orifices was discussed and in that event would require percutaneous nephrostomy placement.  A possible need for bladder biopsy and TURBT were both discussed.  She indicated all questions were answered and desires to proceed.   John Giovanni, MD   04/13/2020, 4:05 PM  John Giovanni,  MD

## 2020-04-14 ENCOUNTER — Encounter
Admission: EM | Disposition: A | Discharge status: Skilled Nursing Facility | Payer: Self-pay | Source: Skilled Nursing Facility | Attending: Internal Medicine

## 2020-04-14 ENCOUNTER — Inpatient Hospital Stay: Payer: Medicare (Managed Care)

## 2020-04-14 ENCOUNTER — Inpatient Hospital Stay: Payer: Medicare (Managed Care) | Admitting: Anesthesiology

## 2020-04-14 ENCOUNTER — Encounter: Payer: Self-pay | Admitting: Internal Medicine

## 2020-04-14 DIAGNOSIS — N3289 Other specified disorders of bladder: Secondary | ICD-10-CM

## 2020-04-14 DIAGNOSIS — N179 Acute kidney failure, unspecified: Secondary | ICD-10-CM | POA: Diagnosis present

## 2020-04-14 DIAGNOSIS — I10 Essential (primary) hypertension: Secondary | ICD-10-CM

## 2020-04-14 DIAGNOSIS — I480 Paroxysmal atrial fibrillation: Secondary | ICD-10-CM

## 2020-04-14 DIAGNOSIS — N1330 Unspecified hydronephrosis: Secondary | ICD-10-CM

## 2020-04-14 DIAGNOSIS — D494 Neoplasm of unspecified behavior of bladder: Secondary | ICD-10-CM

## 2020-04-14 HISTORY — PX: TRANSURETHRAL RESECTION OF BLADDER TUMOR: SHX2575

## 2020-04-14 HISTORY — PX: CYSTOSCOPY WITH STENT PLACEMENT: SHX5790

## 2020-04-14 LAB — TYPE AND SCREEN
ABO/RH(D): O POS
Antibody Screen: NEGATIVE

## 2020-04-14 LAB — CBC
HCT: 23.8 % — ABNORMAL LOW (ref 36.0–46.0)
Hemoglobin: 7.7 g/dL — ABNORMAL LOW (ref 12.0–15.0)
MCH: 33.5 pg (ref 26.0–34.0)
MCHC: 32.4 g/dL (ref 30.0–36.0)
MCV: 103.5 fL — ABNORMAL HIGH (ref 80.0–100.0)
Platelets: 253 10*3/uL (ref 150–400)
RBC: 2.3 MIL/uL — ABNORMAL LOW (ref 3.87–5.11)
RDW: 14.6 % (ref 11.5–15.5)
WBC: 7.7 10*3/uL (ref 4.0–10.5)
nRBC: 0 % (ref 0.0–0.2)

## 2020-04-14 LAB — BASIC METABOLIC PANEL
Anion gap: 10 (ref 5–15)
BUN: 45 mg/dL — ABNORMAL HIGH (ref 8–23)
CO2: 22 mmol/L (ref 22–32)
Calcium: 9.4 mg/dL (ref 8.9–10.3)
Chloride: 112 mmol/L — ABNORMAL HIGH (ref 98–111)
Creatinine, Ser: 4.41 mg/dL — ABNORMAL HIGH (ref 0.44–1.00)
GFR calc Af Amer: 10 mL/min — ABNORMAL LOW (ref >60)
GFR calc non Af Amer: 8 mL/min — ABNORMAL LOW (ref >60)
Glucose, Bld: 108 mg/dL — ABNORMAL HIGH (ref 70–99)
Potassium: 4.6 mmol/L (ref 3.5–5.1)
Sodium: 144 mmol/L (ref 135–145)

## 2020-04-14 LAB — IRON AND TIBC
Iron: 26 ug/dL — ABNORMAL LOW (ref 28–170)
Saturation Ratios: 11 % (ref 10.4–31.8)
TIBC: 239 ug/dL — ABNORMAL LOW (ref 250–450)
UIBC: 213 ug/dL

## 2020-04-14 LAB — TSH: TSH: 4.298 u[IU]/mL (ref 0.350–4.500)

## 2020-04-14 LAB — FERRITIN: Ferritin: 50 ng/mL (ref 11–307)

## 2020-04-14 LAB — PARATHYROID HORMONE, INTACT (NO CA): PTH: 33 pg/mL (ref 15–65)

## 2020-04-14 LAB — URINE CULTURE: Culture: 20000 — AB

## 2020-04-14 LAB — VITAMIN B12: Vitamin B-12: 242 pg/mL (ref 180–914)

## 2020-04-14 SURGERY — CYSTOSCOPY, WITH STENT INSERTION
Anesthesia: General | Laterality: Bilateral

## 2020-04-14 MED ORDER — OLANZAPINE 5 MG PO TABS
5.0000 mg | ORAL_TABLET | Freq: Every day | ORAL | Status: DC
  Administered 2020-04-14 – 2020-04-16 (×3): 5 mg via ORAL
  Filled 2020-04-14 (×4): qty 1

## 2020-04-14 MED ORDER — SUCCINYLCHOLINE CHLORIDE 20 MG/ML IJ SOLN
INTRAMUSCULAR | Status: DC | PRN
  Administered 2020-04-14: 140 mg via INTRAVENOUS

## 2020-04-14 MED ORDER — FENTANYL CITRATE (PF) 100 MCG/2ML IJ SOLN: 25.0000 ug | INTRAMUSCULAR | Status: DC | PRN

## 2020-04-14 MED ORDER — LACTATED RINGERS IV SOLN: INTRAVENOUS | Status: DC | PRN

## 2020-04-14 MED ORDER — LIDOCAINE HCL (CARDIAC) PF 100 MG/5ML IV SOSY
PREFILLED_SYRINGE | INTRAVENOUS | Status: DC | PRN
  Administered 2020-04-14: 50 mg via INTRAVENOUS

## 2020-04-14 MED ORDER — ACETAMINOPHEN 325 MG PO TABS
ORAL_TABLET | ORAL | Status: AC
  Administered 2020-04-14: 650 mg via ORAL
  Filled 2020-04-14: qty 2

## 2020-04-14 MED ORDER — ACETAMINOPHEN 160 MG/5ML PO SOLN
325.0000 mg | ORAL | Status: DC | PRN
  Filled 2020-04-14: qty 20.3

## 2020-04-14 MED ORDER — CHLORHEXIDINE GLUCONATE CLOTH 2 % EX PADS
6.0000 | MEDICATED_PAD | Freq: Every day | CUTANEOUS | Status: DC
  Administered 2020-04-15 – 2020-04-25 (×11): 6 via TOPICAL

## 2020-04-14 MED ORDER — DEXAMETHASONE SODIUM PHOSPHATE 10 MG/ML IJ SOLN
INTRAMUSCULAR | Status: DC | PRN
  Administered 2020-04-14: 5 mg via INTRAVENOUS

## 2020-04-14 MED ORDER — ONDANSETRON HCL 4 MG/2ML IJ SOLN: 4.0000 mg | Freq: Once | INTRAMUSCULAR | Status: DC | PRN

## 2020-04-14 MED ORDER — FENTANYL CITRATE (PF) 100 MCG/2ML IJ SOLN
INTRAMUSCULAR | Status: DC | PRN
Start: 2020-04-14 — End: 2020-04-14
  Administered 2020-04-14: 25 ug via INTRAVENOUS
  Administered 2020-04-14: 50 ug via INTRAVENOUS
  Administered 2020-04-14: 25 ug via INTRAVENOUS

## 2020-04-14 MED ORDER — PHENYLEPHRINE HCL (PRESSORS) 10 MG/ML IV SOLN
INTRAVENOUS | Status: DC | PRN
  Administered 2020-04-14 (×2): 200 ug via INTRAVENOUS
  Administered 2020-04-14: 100 ug via INTRAVENOUS

## 2020-04-14 MED ORDER — METOPROLOL SUCCINATE ER 25 MG PO TB24
25.0000 mg | ORAL_TABLET | Freq: Every day | ORAL | Status: DC
  Administered 2020-04-14 – 2020-04-25 (×12): 25 mg via ORAL
  Filled 2020-04-14 (×13): qty 1

## 2020-04-14 MED ORDER — FENTANYL CITRATE (PF) 100 MCG/2ML IJ SOLN
INTRAMUSCULAR | Status: AC
Start: 2020-04-14 — End: ?
  Filled 2020-04-14: qty 2

## 2020-04-14 MED ORDER — PROPOFOL 10 MG/ML IV BOLUS
INTRAVENOUS | Status: DC | PRN
  Administered 2020-04-14: 100 mg via INTRAVENOUS

## 2020-04-14 MED ORDER — POLYETHYLENE GLYCOL 3350 17 G PO PACK
17.0000 g | PACK | Freq: Every day | ORAL | Status: DC
  Administered 2020-04-18 – 2020-04-25 (×7): 17 g via ORAL
  Filled 2020-04-14 (×9): qty 1

## 2020-04-14 MED ORDER — ACETAMINOPHEN 325 MG PO TABS: 325.0000 mg | ORAL_TABLET | ORAL | Status: DC | PRN

## 2020-04-14 MED ORDER — ONDANSETRON HCL 4 MG/2ML IJ SOLN
INTRAMUSCULAR | Status: DC | PRN
  Administered 2020-04-14: 4 mg via INTRAVENOUS

## 2020-04-14 MED ORDER — SODIUM CHLORIDE 0.9 % IV SOLN: INTRAVENOUS | Status: DC

## 2020-04-14 SURGICAL SUPPLY — 22 items
BAG DRAIN CYSTO-URO LG1000N (MISCELLANEOUS) ×4 IMPLANT
BAG URINE DRAIN 2000ML AR STRL (UROLOGICAL SUPPLIES) ×2 IMPLANT
BRUSH SCRUB EZ 1% IODOPHOR (MISCELLANEOUS) ×4 IMPLANT
CATH FOL 2WAY LX 18X30 (CATHETERS) ×2 IMPLANT
CATH URETL 5X70 OPEN END (CATHETERS) IMPLANT
GLOVE BIOGEL PI IND STRL 7.5 (GLOVE) ×2 IMPLANT
GLOVE BIOGEL PI INDICATOR 7.5 (GLOVE) ×2
GOWN STRL REUS W/ TWL XL LVL3 (GOWN DISPOSABLE) ×2 IMPLANT
GOWN STRL REUS W/TWL XL LVL3 (GOWN DISPOSABLE) ×2
GUIDEWIRE STR DUAL SENSOR (WIRE) ×4 IMPLANT
HOLDER FOLEY CATH W/STRAP (MISCELLANEOUS) ×2 IMPLANT
KIT TURNOVER CYSTO (KITS) ×4 IMPLANT
PACK CYSTO AR (MISCELLANEOUS) ×4 IMPLANT
SET CYSTO W/LG BORE CLAMP LF (SET/KITS/TRAYS/PACK) ×4 IMPLANT
SOL .9 NS 3000ML IRR  AL (IV SOLUTION) ×8
SOL .9 NS 3000ML IRR UROMATIC (IV SOLUTION) ×2 IMPLANT
STENT URET 6FRX24 CONTOUR (STENTS) IMPLANT
STENT URET 6FRX26 CONTOUR (STENTS) IMPLANT
SURGILUBE 2OZ TUBE FLIPTOP (MISCELLANEOUS) ×4 IMPLANT
SYR TOOMEY IRRIG 70ML (MISCELLANEOUS) ×4
SYRINGE TOOMEY IRRIG 70ML (MISCELLANEOUS) IMPLANT
WATER STERILE IRR 1000ML POUR (IV SOLUTION) ×4 IMPLANT

## 2020-04-14 NOTE — Interval H&P Note (Signed)
History and Physical Interval Note:  04/14/2020 12:30 PM  Christy Mccann  has presented today for surgery, with the diagnosis of bilateral stone.  The various methods of treatment have been discussed with the patient and family. After consideration of risks, benefits and other options for treatment, the patient has consented to  Procedure(s): CYSTOSCOPY WITH STENT PLACEMENT POSSIBLE BLADDER BIOPSY (Bilateral) as a surgical intervention.  The patient's history has been reviewed, patient examined, no change in status, stable for surgery.  I have reviewed the patient's chart and labs.  Questions were answered to the patient's satisfaction.     [place]

## 2020-04-14 NOTE — ED Notes (Signed)
Lab collects pt type and screen at this time

## 2020-04-14 NOTE — Op Note (Signed)
Preoperative diagnosis:  1. Bilateral hydronephrosis 2. Hematuria  Postoperative diagnosis:  1. Bladder tumor 2. Bilateral hydronephrosis  Procedure: 1. Cystoscopy 2. Transurethral resection of bladder tumor (>5 cm)  Surgeon: Abbie Sons, MD  Anesthesia: General  Complications: None  Intraoperative findings:  1. Nodular tumor involving entire trigone up to bladder neck and infero-lateral walls measured at 4 x 7 cm; suspicious for muscle invasive disease.  Tumor incompletely resected 2. Ureteral orifices not identified  EBL: Minimal  Specimens:   Bladder tumor  Base of bladder tumor  Indication: Christy Mccann is a 86 y.o. admitted with creatinine 4.8 and CT showing severe bilateral hydronephrosis/hydroureter to the bladder.  Intermittent hematuria past several weeks.  Creatinine stable at 4.4 and does have bilateral parenchymal thinning indicating a probable chronic process.  After reviewing the management options for treatment, he elected to proceed with the above surgical procedure(s). We have discussed the potential benefits and risks of the procedure, side effects of the proposed treatment, the likelihood of the patient achieving the goals of the procedure, and any potential problems that might occur during the procedure or recuperation. Informed consent has been obtained.  Description of procedure:  The patient was taken to the operating room and general anesthesia was induced.  The patient was placed in the dorsal lithotomy position, prepped and draped in the usual sterile fashion.  She is receiving daily IV antibiotics. A preoperative time-out was performed.   A 21 French cystoscope was lubricated and passed per urethra.  Panendoscopy was then performed with findings as described above.  The ureteral orifices were not identified.  The tumor was nodular and solid with minimal papillary change.  The cystoscope was removed and a 26 French continuous-flow resectoscope  sheath with obturator was lubricated and easily passed per urethra.  The [facility] resectoscope with loop was then placed into the sheath.  The tumor was again examined and ureteral orifices were not identified.  Using bipolar resection and starting at the left lateral aspect of the tumor working posterior to anterior the tumor was resected down to superficial muscle.  This resection was carried medially across the area of the trigone and ending at the bladder neck and then working laterally to resect the right lateral aspect of the tumor.  The specimen was removed via irrigation and sent as bladder tumor.  Hemostasis was obtained with cautery.  The tumor bed was examined and the ureteral orifices were not identified during the resection for post resection.  Additional resection of the tumor bed was accomplished and sent separately.  There appeared to be tumor still present with deeper resection.  Additional resection was carried out in the expected location of the ureteral orifices and again they were not identified.  At the completion of the procedure hemostasis was adequate.  The resectoscope was removed and an 94 French Foley catheter was placed with return of clear effluent upon irrigation  After anesthetic reversal she was transported to the PACU in stable condition.  Plan: We will discuss findings with patient and the options of percutaneous nephrostomy placement which was also discussed preoperatively   Abbie Sons, M.D.

## 2020-04-14 NOTE — ED Notes (Signed)
See paper chart for information 0100 to now

## 2020-04-14 NOTE — Anesthesia Procedure Notes (Signed)
Procedure Name: Intubation Date/Time: 04/14/2020 12:44 PM Performed by: Louann Sjogren, CRNA Pre-anesthesia Checklist: Patient identified, Patient being monitored, Timeout performed, Emergency Drugs available and Suction available Patient Re-evaluated:Patient Re-evaluated prior to induction Oxygen Delivery Method: Circle system utilized Preoxygenation: Pre-oxygenation with 100% oxygen Induction Type: IV induction Ventilation: Mask ventilation without difficulty Laryngoscope Size: Mac and 3 Grade View: Grade I Tube type: Oral Tube size: 7.0 mm Number of attempts: 1 Airway Equipment and Method: Stylet Placement Confirmation: ETT inserted through vocal cords under direct vision,  positive ETCO2 and breath sounds checked- equal and bilateral Secured at: 21 cm Tube secured with: Tape Dental Injury: Teeth and Oropharynx as per pre-operative assessment

## 2020-04-14 NOTE — Transfer of Care (Signed)
Immediate Anesthesia Transfer of Care Note  Patient: Christy Mccann  Procedure(s) Performed: CYSTOSCOPY (Bilateral ) TRANSURETHRAL RESECTION OF BLADDER TUMOR (TURBT)  Patient Location: PACU  Anesthesia Type:General  Level of Consciousness: awake and alert   Airway & Oxygen Therapy: Patient Spontanous Breathing  Post-op Assessment: Report given to RN and Post -op Vital signs reviewed and stable  Post vital signs: Reviewed and stable  Last Vitals:  Vitals Value Taken Time  BP 132/85 04/14/20 1408  Temp 36.6 C 04/14/20 1404  Pulse 101 04/14/20 1404  Resp 15 04/14/20 1408  SpO2 100 % 04/14/20 1404  Vitals shown include unvalidated device data.  Last Pain:  Vitals:   04/14/20 1032  TempSrc: Temporal  PainSc: 0-No pain         Complications: No complications documented.

## 2020-04-14 NOTE — ED Notes (Signed)
Lab contacts this nurse and states that patient type and screen is hemolyzed despite all other blood tests resulting. Called at 0530 and blood was collected at 0437. Requests lab tech to come and collect sample due to trouble with blood tube

## 2020-04-14 NOTE — ED Notes (Signed)
Pt in bed asleep with lights off and safety sitter at bedside

## 2020-04-14 NOTE — Progress Notes (Signed)
Patient ID: Christy Mccann, female   DOB: 06-30-1931, 86 y.o.   MRN: [id number] [facility] PROGRESS NOTE  BERDIE MALTER LKG:[id number] DOB: 1931-03-16 DOA: 04/13/2020 PCP: [facility]  HPI/Subjective: Patient seen this morning.  Answers some questions appropriately.  Sitter was placed because she was trying to get out of the bed.  Objective: Vitals:   04/14/20 1419 04/14/20 1434  BP: (!) 151/100 (!) 173/98  Pulse:  100  Resp: (!) 29 18  Temp:    SpO2:  100%    Filed Weights   04/12/20 1817 04/14/20 1032  Weight: 45.4 kg 45 kg    ROS: Review of Systems  Respiratory: Negative for shortness of breath.   Cardiovascular: Negative for chest pain.  Gastrointestinal: Negative for abdominal pain, nausea and vomiting.   Exam: Physical Exam HENT:     Nose: No mucosal edema.     Mouth/Throat:     Pharynx: No oropharyngeal exudate.  Eyes:     General: Lids are normal.     Conjunctiva/sclera: Conjunctivae normal.     Pupils: Pupils are equal, round, and reactive to light.  Cardiovascular:     Rate and Rhythm: Normal rate. Rhythm irregularly irregular.     Heart sounds: Normal heart sounds, S1 normal and S2 normal.  Pulmonary:     Breath sounds: No decreased breath sounds, wheezing, rhonchi or rales.  Abdominal:     Palpations: Abdomen is soft.     Tenderness: There is no abdominal tenderness.  Musculoskeletal:     Right lower leg: No swelling.     Left lower leg: No swelling.  Skin:    General: Skin is warm.     Findings: No rash.  Neurological:     Mental Status: She is alert.     Comments: Answers some some simple yes or no questions.       Data Reviewed: Basic Metabolic Panel: Recent Labs  Lab 04/12/20 1608 04/13/20 0223 04/14/20 0437  NA 139 139 144  K 5.1 4.2 4.6  CL 100 101 112*  CO2 25 24 22   GLUCOSE 125* 135* 108*  BUN 52* 52* 45*  CREATININE 4.81* 4.47* 4.41*  CALCIUM 9.8 10.5* 9.4   Liver Function Tests: Recent Labs  Lab  04/13/20 0223  AST 20  ALT 11  ALKPHOS 72  BILITOT 0.6  PROT 7.3  ALBUMIN 4.1   CBC: Recent Labs  Lab 04/12/20 1608 04/13/20 0223 04/13/20 0714 04/13/20 1915 04/14/20 0437  WBC 8.9 10.8*  --   --  7.7  NEUTROABS  --  8.0*  --   --   --   HGB 8.9* 9.1* 7.7* 9.1* 7.7*  HCT 28.4* 27.5* 24.7* 28.5* 23.8*  MCV 105.6* 102.6*  --   --  103.5*  PLT 305 314  --   --  253     Recent Results (from the past 240 hour(s))  SARS Coronavirus 2 by RT PCR (hospital order, performed in [facility] hospital lab) Nasopharyngeal Nasopharyngeal Swab     Status: None   Collection Time: 04/13/20  2:23 AM   Specimen: Nasopharyngeal Swab  Result Value Ref Range Status   SARS Coronavirus 2 NEGATIVE NEGATIVE Final    Comment: (NOTE) SARS-CoV-2 target nucleic acids are NOT DETECTED.  The SARS-CoV-2 RNA is generally detectable in upper and lower respiratory specimens during the acute phase of infection. The lowest concentration of SARS-CoV-2 viral copies this assay can detect is 250 copies / mL. A negative  result does not preclude SARS-CoV-2 infection and should not be used as the sole basis for treatment or other patient management decisions.  A negative result may occur with improper specimen collection / handling, submission of specimen other than nasopharyngeal swab, presence of viral mutation(s) within the areas targeted by this assay, and inadequate number of viral copies (<250 copies / mL). A negative result must be combined with clinical observations, patient history, and epidemiological information.  Fact Sheet for Patients:   StrictlyIdeas.no  Fact Sheet for Healthcare Providers: BankingDealers.co.za  This test is not yet approved or  cleared by the [place] FDA and has been authorized for detection and/or diagnosis of SARS-CoV-2 by FDA under an Emergency Use Authorization (EUA).  This EUA will remain in effect (meaning this test  can be used) for the duration of the COVID-19 declaration under Section 564(b)(1) of the Act, 21 U.S.C. section 360bbb-3(b)(1), unless the authorization is terminated or revoked sooner.  Performed at [facility], [address] [place], [place] [address]   Urine culture     Status: Abnormal   Collection Time: 04/13/20  2:23 AM   Specimen: Urine, Random  Result Value Ref Range Status   Specimen Description   Final    URINE, RANDOM Performed at [facility], [address], [place], [place] [address]    Special Requests   Final    NONE Performed at [facility], [address] [place], [place] [address]    Culture (A)  Final    20,000 COLONIES/mL MULTIPLE SPECIES PRESENT, SUGGEST RECOLLECTION   Report Status 04/14/2020 FINAL  Final     Studies: DG Chest Port 1 View  Result Date: 04/13/2020 CLINICAL DATA:  Acute renal failure EXAM: PORTABLE CHEST 1 VIEW COMPARISON:  Radiograph 10/28/2013 (report only) FINDINGS: Hyperinflation of the lungs, reportedly chronic. No focal consolidative opacity is seen. No convincing features of edema. No pneumothorax or visible effusion. Large hiatal hernia is present. Dense material along the right mediastinal border may reflect material within the esophagus or calcified hilar nodes. Remaining cardiomediastinal contours are unremarkable accounting for positioning secondary to marked levocurvature of the lumbar spine with severe multilevel discogenic changes and additional degenerative changes in the shoulders. IMPRESSION: 1. No acute cardiopulmonary disease. 2. Large hiatal hernia. Density along the right mediastinal border may reflect material within the esophagus or calcified hilar nodes. 3. Hyperinflation, likely emphysematous change. Electronically Signed   By: Lovena Le M.D.   On: 04/13/2020 02:24   DG OR UROLOGY CYSTO IMAGE ([place])  Result Date: 04/14/2020 There is no interpretation for this exam.  This order is  for images obtained during a surgical procedure.  Please See "Surgeries" Tab for more information regarding the procedure.   CT Renal Stone Study  Result Date: 04/13/2020 CLINICAL DATA:  Acute renal failure. EXAM: CT ABDOMEN AND PELVIS WITHOUT CONTRAST TECHNIQUE: Multidetector CT imaging of the abdomen and pelvis was performed following the standard protocol without IV contrast. COMPARISON:  None. FINDINGS: Lower chest: No consolidation or pleural fluid. Moderate to large hiatal hernia with greater than 50% of the stomach intrathoracic. Hepatobiliary: Focal hepatic abnormality on noncontrast exam. Gallbladder minimally distended. No calcified gallstone. No biliary dilatation. Pancreas: Mild parenchymal atrophy. No ductal dilatation or inflammation. Spleen: Normal in size without focal abnormality. Adrenals/Urinary Tract: No adrenal nodule. There is marked bilateral hydroureteronephrosis. Thinning of bilateral renal parenchyma. No significant perinephric edema. Both ureters are dilated to the urinary bladder insertion. Bladder is essentially empty with  equivocal circumferential wall thickening. No renal calculi. Stomach/Bowel: Moderately large hiatal hernia with greater than 50% of the stomach intrathoracic. No evidence of small bowel obstruction or inflammation. The appendix is not definitively visualized. Moderate volume of stool throughout the colon. There is colonic redundancy and tortuosity. Distal descending and sigmoid colon are nondistended, however there is stool distending the rectum with rectal distention of 6.6 cm. No rectal or colonic wall thickening. Vascular/Lymphatic: Aortic atherosclerosis and tortuosity. No aortic aneurysm. Limited assessment for adenopathy in the absence of contrast and paucity of intra-abdominal fat. No bulky enlarged abdominopelvic lymph nodes. Reproductive: The uterus is not visualized, may be surgically absent or atrophic. No evidence of adnexal mass. Other: Few scattered  calcifications in the pelvis may be phleboliths or calcified nodes. There is no ascites. No free air. Musculoskeletal: Degenerative change of the pubic symphysis. Scoliosis and multilevel degenerative change in the spine. IMPRESSION: 1. Marked bilateral hydroureteronephrosis. Both ureters are dilated to the urinary bladder insertion. There is thinning of the bilateral renal parenchyma. Lack of perinephric edema suggests this may be a chronic process. 2. Decompressed bladder with equivocal circumferential wall thickening. 3. Moderate to large hiatal hernia with greater than 50% of the stomach intrathoracic. 4. Moderate volume of stool throughout the colon with colonic redundancy and tortuosity, suggesting constipation. Rectal distention of 6.6 cm, recommend correlation for fecal impaction. Aortic Atherosclerosis (ICD10-I70.0). Electronically Signed   By: Keith Rake M.D.   On: 04/13/2020 03:42    Scheduled Meds: . [MAR Hold] amLODipine  5 mg Oral Daily  . [MAR Hold] bisacodyl  10 mg Rectal Daily  . [MAR Hold] citalopram  20 mg Oral Daily  . [MAR Hold] famotidine  10 mg Oral QHS  . [MAR Hold] hydrALAZINE  100 mg Oral TID  . [MAR Hold] lactulose  30 g Oral Daily  . [MAR Hold] latanoprost  1 drop Both Eyes QHS  . [MAR Hold] lidocaine  1 patch Transdermal Q24H  . [MAR Hold] pantoprazole  40 mg Oral BID  . [MAR Hold] sodium phosphate  1 enema Rectal QHS  . [MAR Hold] timolol  1 drop Both Eyes q AM  . [MAR Hold] traZODone  100 mg Oral QHS   Continuous Infusions: . sodium chloride Stopped (04/14/20 1232)  . [MAR Hold] cefTRIAXone (ROCEPHIN)  IV 0 g (04/13/20 1609)    Assessment/Plan:  1. Acute kidney injury of bilateral hydronephrosis.  Cystoscopy showed a large bladder mass.  Urology will order for nephrostomy tubes and consider stenting procedure tomorrow. 2. Acute blood loss anemia with hematuria.  Large bladder mass seen on cystoscopy today.  Monitor hemoglobin and transfuse as needed.   Continue to hold Xarelto. 3. Paroxysmal atrial fibrillation.  Will add low-dose Toprol.  No blood thinner with bleeding bladder mass. 4. Essential hypertension on Norvasc and hydralazine. 5. Cognitive issues as per son.  B12 low normal and TSH normal range.  Will start Zyprexa at night since sitter was ordered. 6. Slight hypercalcemia yesterday which improved today with hydration.  Follow-up labs 7. Constipation seen on CT scan.  Start MiraLAX continue Fleet enemas and Dulcolax suppositories until bowel movement. 8. Large hiatal hernia on CT scan on PPI      Code Status:     Code Status Orders  (From admission, onward)         Start     Ordered   04/13/20 0617  Full code  Continuous        04/13/20 [phone]  Code Status History    This patient has a current code status but no historical code status.   Advance Care Planning Activity    Advance Directive Documentation     Most Recent Value  Type of Advance Directive Out of facility DNR (pink MOST or yellow form)  [see pink sheet]  Pre-existing out of facility DNR order (yellow form or pink MOST form) Pink MOST/Yellow Form most recent copy in chart - Physician notified to receive inpatient order  "MOST" Form in Place? --     Family Communication: Spoke with son on the phone Disposition Plan: Status is: Inpatient  Dispo: The patient is from: Home              Anticipated d/c is to: Home              Anticipated d/c date is: Likely will need a few more days here in the hospital to get kidney function better and urine flowing.              Patient currently found to have a bladder mass.  Patient also has acute kidney injury and hydronephrosis.  Will need to have better urine output.  Consultants:  Urology  Procedures:  Cystoscopy  Time spent: 28 minutes  [place]

## 2020-04-14 NOTE — ED Notes (Signed)
Dr. Earleen Newport at pt bedside at this time

## 2020-04-14 NOTE — ED Notes (Signed)
See paper chart 

## 2020-04-14 NOTE — Anesthesia Preprocedure Evaluation (Addendum)
Anesthesia Evaluation  Patient identified by MRN, date of birth, ID band Patient awake    Reviewed: Allergy & Precautions, H&P , NPO status , reviewed documented beta blocker date and time   Airway Mallampati: II  TM Distance: >3 FB Neck ROM: limited    Dental  (+) Edentulous Upper, Poor Dentition, Chipped, Missing   Pulmonary    Pulmonary exam normal        Cardiovascular hypertension, Normal cardiovascular exam+ dysrhythmias Atrial Fibrillation      Neuro/Psych PSYCHIATRIC DISORDERS Depression    GI/Hepatic hiatal hernia, GERD  ,HH with 50% of stomach intrathoracic   Endo/Other  Hypothyroidism   Renal/GU Renal disease     Musculoskeletal   Abdominal   Peds  Hematology  (+) Blood dyscrasia, anemia ,   Anesthesia Other Findings Past Medical History: No date: A-fib [facility]), previously on anti-coagulants, last dose 2 days ago Xarelto No date: Bradycardia No date: Depression No date: Hypertension No date: Hypothyroid History reviewed. No pertinent surgical history.   Reproductive/Obstetrics                           Anesthesia Physical Anesthesia Plan  ASA: III  Anesthesia Plan: General   Post-op Pain Management:    Induction: Intravenous  PONV Risk Score and Plan: Ondansetron and Treatment may vary due to age or medical condition  Airway Management Planned: Oral ETT  Additional Equipment:   Intra-op Plan:   Post-operative Plan: Extubation in OR  Informed Consent: I have reviewed the patients History and Physical, chart, labs and discussed the procedure including the risks, benefits and alternatives for the proposed anesthesia with the patient or authorized representative who has indicated his/her understanding and acceptance.     Dental Advisory Given  Plan Discussed with: CRNA  Anesthesia Plan Comments:         Anesthesia Quick Evaluation

## 2020-04-14 NOTE — Anesthesia Postprocedure Evaluation (Signed)
Anesthesia Post Note  Patient: Christy Mccann  Procedure(s) Performed: CYSTOSCOPY (Bilateral ) TRANSURETHRAL RESECTION OF BLADDER TUMOR (TURBT)  Patient location during evaluation: PACU Anesthesia Type: General Level of consciousness: awake and alert Pain management: pain level controlled Vital Signs Assessment: post-procedure vital signs reviewed and stable Respiratory status: spontaneous breathing, nonlabored ventilation and respiratory function stable Cardiovascular status: blood pressure returned to baseline and stable (Pt slightly elevated, however did not take BP meds x 2 days. To resume regular schedule) Postop Assessment: no apparent nausea or vomiting Anesthetic complications: no   No complications documented.   Last Vitals:  Vitals:   04/14/20 1419 04/14/20 1434  BP: (!) 151/100 (!) 173/98  Pulse:  100  Resp: (!) 29 18  Temp:    SpO2:  100%    Last Pain:  Vitals:   04/14/20 1419  TempSrc:   PainSc: 0-No pain                 Alphonsus Sias

## 2020-04-15 ENCOUNTER — Encounter: Payer: Self-pay | Admitting: Urology

## 2020-04-15 ENCOUNTER — Inpatient Hospital Stay: Payer: Medicare (Managed Care)

## 2020-04-15 DIAGNOSIS — E875 Hyperkalemia: Secondary | ICD-10-CM

## 2020-04-15 DIAGNOSIS — N132 Hydronephrosis with renal and ureteral calculous obstruction: Principal | ICD-10-CM

## 2020-04-15 HISTORY — PX: IR NEPHROSTOMY PLACEMENT RIGHT: IMG6064

## 2020-04-15 HISTORY — PX: IR NEPHROSTOMY PLACEMENT LEFT: IMG6063

## 2020-04-15 LAB — CBC
HCT: 28.4 % — ABNORMAL LOW (ref 36.0–46.0)
Hemoglobin: 8.3 g/dL — ABNORMAL LOW (ref 12.0–15.0)
MCH: 33.2 pg (ref 26.0–34.0)
MCHC: 29.2 g/dL — ABNORMAL LOW (ref 30.0–36.0)
MCV: 113.6 fL — ABNORMAL HIGH (ref 80.0–100.0)
Platelets: 268 10*3/uL (ref 150–400)
RBC: 2.5 MIL/uL — ABNORMAL LOW (ref 3.87–5.11)
RDW: 14.6 % (ref 11.5–15.5)
WBC: 11.5 10*3/uL — ABNORMAL HIGH (ref 4.0–10.5)
nRBC: 0 % (ref 0.0–0.2)

## 2020-04-15 LAB — PROTEIN ELECTROPHORESIS, SERUM
A/G Ratio: 1.2 (ref 0.7–1.7)
Albumin ELP: 3.2 g/dL (ref 2.9–4.4)
Alpha-1-Globulin: 0.3 g/dL (ref 0.0–0.4)
Alpha-2-Globulin: 0.8 g/dL (ref 0.4–1.0)
Beta Globulin: 0.8 g/dL (ref 0.7–1.3)
Gamma Globulin: 0.8 g/dL (ref 0.4–1.8)
Globulin, Total: 2.7 g/dL (ref 2.2–3.9)
Total Protein ELP: 5.9 g/dL — ABNORMAL LOW (ref 6.0–8.5)

## 2020-04-15 LAB — BASIC METABOLIC PANEL
Anion gap: 17 — ABNORMAL HIGH (ref 5–15)
BUN: 46 mg/dL — ABNORMAL HIGH (ref 8–23)
CO2: 13 mmol/L — ABNORMAL LOW (ref 22–32)
Calcium: 9.5 mg/dL (ref 8.9–10.3)
Chloride: 111 mmol/L (ref 98–111)
Creatinine, Ser: 5.62 mg/dL — ABNORMAL HIGH (ref 0.44–1.00)
GFR calc Af Amer: 7 mL/min — ABNORMAL LOW (ref >60)
GFR calc non Af Amer: 6 mL/min — ABNORMAL LOW (ref >60)
Glucose, Bld: 117 mg/dL — ABNORMAL HIGH (ref 70–99)
Potassium: 5.3 mmol/L — ABNORMAL HIGH (ref 3.5–5.1)
Sodium: 141 mmol/L (ref 135–145)

## 2020-04-15 LAB — GLUCOSE, CAPILLARY: Glucose-Capillary: 141 mg/dL — ABNORMAL HIGH (ref 70–99)

## 2020-04-15 MED ORDER — DEXTROSE 50 % IV SOLN
1.0000 | Freq: Once | INTRAVENOUS | Status: AC
  Administered 2020-04-15: 50 mL via INTRAVENOUS
  Filled 2020-04-15: qty 50

## 2020-04-15 MED ORDER — SODIUM CHLORIDE 0.9% FLUSH
5.0000 mL | Freq: Two times a day (BID) | INTRAVENOUS | Status: DC
  Administered 2020-04-16 – 2020-04-25 (×16): 5 mL

## 2020-04-15 MED ORDER — ONDANSETRON HCL 4 MG/2ML IJ SOLN
INTRAMUSCULAR | Status: AC | PRN
  Administered 2020-04-15: 4 mg via INTRAVENOUS

## 2020-04-15 MED ORDER — ONDANSETRON HCL 4 MG/2ML IJ SOLN
INTRAMUSCULAR | Status: AC
  Filled 2020-04-15: qty 2

## 2020-04-15 MED ORDER — INSULIN ASPART 100 UNIT/ML IV SOLN
10.0000 [IU] | Freq: Once | INTRAVENOUS | Status: AC
  Administered 2020-04-15: 10 [IU] via INTRAVENOUS
  Filled 2020-04-15: qty 0.1

## 2020-04-15 MED ORDER — BISACODYL 10 MG RE SUPP: 10.0000 mg | Freq: Every day | RECTAL | Status: DC | PRN

## 2020-04-15 MED ORDER — CALCIUM GLUCONATE-NACL 1-0.675 GM/50ML-% IV SOLN
1.0000 g | Freq: Once | INTRAVENOUS | Status: AC
  Administered 2020-04-15: 1000 mg via INTRAVENOUS
  Filled 2020-04-15: qty 50

## 2020-04-15 MED ORDER — SODIUM ZIRCONIUM CYCLOSILICATE 10 G PO PACK
10.0000 g | PACK | Freq: Two times a day (BID) | ORAL | Status: DC
  Administered 2020-04-15 – 2020-04-16 (×3): 10 g via ORAL
  Filled 2020-04-15 (×5): qty 1

## 2020-04-15 MED ORDER — PNEUMOCOCCAL VAC POLYVALENT 25 MCG/0.5ML IJ INJ
0.5000 mL | INJECTION | INTRAMUSCULAR | Status: DC
  Filled 2020-04-15: qty 0.5

## 2020-04-15 MED ORDER — CEFAZOLIN SODIUM-DEXTROSE 2-4 GM/100ML-% IV SOLN
2.0000 g | Freq: Once | INTRAVENOUS | Status: AC
  Administered 2020-04-15: 2 g via INTRAVENOUS
  Filled 2020-04-15: qty 100

## 2020-04-15 MED ORDER — SODIUM BICARBONATE 8.4 % IV SOLN
25.0000 meq | Freq: Once | INTRAVENOUS | Status: AC
  Administered 2020-04-15: 25 meq via INTRAVENOUS
  Filled 2020-04-15: qty 50

## 2020-04-15 MED ORDER — IODIXANOL 320 MG/ML IV SOLN
50.0000 mL | Freq: Once | INTRAVENOUS | Status: AC
  Administered 2020-04-15: 25 mL

## 2020-04-15 NOTE — Procedures (Signed)
Interventional Radiology Procedure Note  Procedure: Bilateral percutaneous nephrostomy tube placement  Complications: None  Estimated Blood Loss: None  Findings: Bilateral 10 Fr percutaneous nephrostomy tubes placed and attached to gravity bag drainage.   Venetia Night. Kathlene Cote, M.D Pager:  [phone]

## 2020-04-15 NOTE — Progress Notes (Signed)
Patient ID: Christy Mccann, female   DOB: Aug 01, 1931, 86 y.o.   MRN: [id number] [facility] PROGRESS NOTE  RAFEEF LAU XBJ:[id number] DOB: [birth year] DOA: 04/13/2020 PCP: [facility]  HPI/Subjective: Patient answers some yes or no questions.  She asked me what went on last night.  I stated that she was trying to get out of the bed and they do not want her to get out of the bed on her own and that is why they came into see her.  Objective: Vitals:   04/15/20 1500 04/15/20 1505  BP: (!) 157/77 (!) 141/91  Pulse: 100 81  Resp: 13 17  Temp:    SpO2: 99% 99%   No intake or output data in the 24 hours ending 04/15/20 1513 Filed Weights   04/14/20 1032 04/14/20 1726 04/14/20 2309  Weight: 45 kg 58.1 kg 50.8 kg    ROS: Review of Systems  Respiratory: Negative for cough and shortness of breath.   Cardiovascular: Negative for chest pain.  Gastrointestinal: Negative for abdominal pain, nausea and vomiting.   Exam: Physical Exam HENT:     Nose: No mucosal edema.     Mouth/Throat:     Pharynx: No oropharyngeal exudate.  Eyes:     General: Lids are normal.     Conjunctiva/sclera: Conjunctivae normal.     Pupils: Pupils are equal, round, and reactive to light.  Cardiovascular:     Rate and Rhythm: Normal rate. Rhythm irregularly irregular.     Heart sounds: Normal heart sounds, S1 normal and S2 normal.  Pulmonary:     Breath sounds: No decreased breath sounds, wheezing, rhonchi or rales.  Abdominal:     Palpations: Abdomen is soft.     Tenderness: There is no abdominal tenderness.  Musculoskeletal:     Right lower leg: No swelling.     Left lower leg: No swelling.  Skin:    General: Skin is warm.     Findings: No rash.  Neurological:     Mental Status: She is alert.     Comments: Answers some yes or no questions but does have agitation at times.       Data Reviewed: Basic Metabolic Panel: Recent Labs  Lab 04/12/20 1608 04/13/20 0223 04/14/20 0437  04/15/20 0525  NA 139 139 144 141  K 5.1 4.2 4.6 5.3*  CL 100 101 112* 111  CO2 25 24 22  13*  GLUCOSE 125* 135* 108* 117*  BUN 52* 52* 45* 46*  CREATININE 4.81* 4.47* 4.41* 5.62*  CALCIUM 9.8 10.5* 9.4 9.5   Liver Function Tests: Recent Labs  Lab 04/13/20 0223  AST 20  ALT 11  ALKPHOS 72  BILITOT 0.6  PROT 7.3  ALBUMIN 4.1   CBC: Recent Labs  Lab 04/12/20 1608 04/12/20 1608 04/13/20 0223 04/13/20 0714 04/13/20 1915 04/14/20 0437 04/15/20 0525  WBC 8.9  --  10.8*  --   --  7.7 11.5*  NEUTROABS  --   --  8.0*  --   --   --   --   HGB 8.9*   < > 9.1* 7.7* 9.1* 7.7* 8.3*  HCT 28.4*   < > 27.5* 24.7* 28.5* 23.8* 28.4*  MCV 105.6*  --  102.6*  --   --  103.5* 113.6*  PLT 305  --  314  --   --  253 268   < > = values in this interval not displayed.    CBG: Recent Labs  Lab  04/15/20 1028  GLUCAP 141*    Recent Results (from the past 240 hour(s))  SARS Coronavirus 2 by RT PCR (hospital order, performed in [facility] hospital lab) Nasopharyngeal Nasopharyngeal Swab     Status: None   Collection Time: 04/13/20  2:23 AM   Specimen: Nasopharyngeal Swab  Result Value Ref Range Status   SARS Coronavirus 2 NEGATIVE NEGATIVE Final    Comment: (NOTE) SARS-CoV-2 target nucleic acids are NOT DETECTED.  The SARS-CoV-2 RNA is generally detectable in upper and lower respiratory specimens during the acute phase of infection. The lowest concentration of SARS-CoV-2 viral copies this assay can detect is 250 copies / mL. A negative result does not preclude SARS-CoV-2 infection and should not be used as the sole basis for treatment or other patient management decisions.  A negative result may occur with improper specimen collection / handling, submission of specimen other than nasopharyngeal swab, presence of viral mutation(s) within the areas targeted by this assay, and inadequate number of viral copies (<250 copies / mL). A negative result must be combined with  clinical observations, patient history, and epidemiological information.  Fact Sheet for Patients:   StrictlyIdeas.no  Fact Sheet for Healthcare Providers: BankingDealers.co.za  This test is not yet approved or  cleared by the [place] FDA and has been authorized for detection and/or diagnosis of SARS-CoV-2 by FDA under an Emergency Use Authorization (EUA).  This EUA will remain in effect (meaning this test can be used) for the duration of the COVID-19 declaration under Section 564(b)(1) of the Act, 21 U.S.C. section 360bbb-3(b)(1), unless the authorization is terminated or revoked sooner.  Performed at [facility], [address], [place], [place] [address]   Urine culture     Status: Abnormal   Collection Time: 04/13/20  2:23 AM   Specimen: Urine, Random  Result Value Ref Range Status   Specimen Description   Final    URINE, RANDOM Performed at [facility], [address], [place], [place] [address]    Special Requests   Final    NONE Performed at [facility], [address] [place], [place] [address]    Culture (A)  Final    20,000 COLONIES/mL MULTIPLE SPECIES PRESENT, SUGGEST RECOLLECTION   Report Status 04/14/2020 FINAL  Final     Studies: DG OR UROLOGY CYSTO IMAGE ([place])  Result Date: 04/14/2020 There is no interpretation for this exam.  This order is for images obtained during a surgical procedure.  Please See "Surgeries" Tab for more information regarding the procedure.    Scheduled Meds: . amLODipine  5 mg Oral Daily  . bisacodyl  10 mg Rectal Daily  . Chlorhexidine Gluconate Cloth  6 each Topical Daily  . citalopram  20 mg Oral Daily  . famotidine  10 mg Oral QHS  . hydrALAZINE  100 mg Oral TID  . lactulose  30 g Oral Daily  . latanoprost  1 drop Both Eyes QHS  . lidocaine  1 patch Transdermal Q24H  . metoprolol succinate  25 mg Oral Daily  . OLANZapine  5 mg Oral  QHS  . ondansetron      . pantoprazole  40 mg Oral BID  . [START ON 04/16/2020] pneumococcal 23 valent vaccine  0.5 mL Intramuscular Tomorrow-1000  . polyethylene glycol  17 g Oral Daily  . sodium phosphate  1 enema Rectal QHS  . sodium zirconium cyclosilicate  10 g Oral BID  . timolol  1 drop Both Eyes  q AM  . traZODone  100 mg Oral QHS   Continuous Infusions: . sodium chloride 40 mL/hr at 04/15/20 1107    Assessment/Plan:  1. Acute kidney injury and bilateral hydronephrosis.  Cystoscopy showed a large bladder mass.  Case discussed with urology and interventional radiology and nephrostomy tubes placed this afternoon.  Hopefully creatinine will start to improve now. 2. Hyperkalemia secondary to acute kidney injury.  Lokelma twice daily.  Short acting meds of calcium, bicarb, insulin and D50 ordered this morning. 3. Acute blood loss anemia with hematuria.  Xarelto on hold.  Large bladder mass seen on cystoscopy.  Continue to monitor hemoglobin. 4. Essential hypertension on Norvasc and hydralazine 5. Paroxysmal atrial fibrillation on low-dose Toprol.  No blood thinner with bleeding bladder mass.  Stroke risk is higher being off blood thinner. 6. Acquired thrombophilia secondary to paroxysmal atrial fibrillation.  Stroke risk higher being off blood thinner 7. Cognitive issues.  B12 low normal.  TSH normal range.  Continue Zyprexa at night. 8. Slight hypercalcemia on previous labs improved with IV fluid hydration 9. Constipation seen on CT scan.  Continue MiraLAX.  As needed Dulcolax suppository and Fleet enemas. 10. Large hiatal hernia on PPI      Code Status:     Code Status Orders  (From admission, onward)         Start     Ordered   04/13/20 0617  Full code  Continuous        04/13/20 0623        Code Status History    This patient has a current code status but no historical code status.   Advance Care Planning Activity    Advance Directive Documentation     Most  Recent Value  Type of Advance Directive Out of facility DNR (pink MOST or yellow form)  [see pink sheet]  Pre-existing out of facility DNR order (yellow form or pink MOST form) Pink MOST/Yellow Form most recent copy in chart - Physician notified to receive inpatient order  "MOST" Form in Place? 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  Nephrostomy tube was placed today  Consultants:  Urology  Interventional radiology  Procedures:  Cystoscopy  Nephrostomy tubes  Time spent: 28 minutes  [place]

## 2020-04-15 NOTE — Progress Notes (Signed)
Pt on the way to room awaiting pt arrival to room 257.

## 2020-04-15 NOTE — Progress Notes (Signed)
Informed Benjamine Mola, NP about pt refusing IV access and to obtain all ordered labs.

## 2020-04-15 NOTE — Progress Notes (Signed)
OT Cancellation Note  Patient Details Name: Christy Mccann MRN: [id number] DOB: [birth year]   Cancelled Treatment:    Reason Eval/Treat Not Completed: Medical issues which prohibited therapy  OT consult received and chart reviewed. Upon speaking with RN and healthcare team, noted that pt is to have nephro tubes placed today and recommend therapy hold until tomorrow. Will f/u for OT evaluation as pt available and appropriate. Thank you.  Gerrianne Scale, [place], OTR/L ascom [phone] 04/15/20, 2:00 PM

## 2020-04-15 NOTE — Progress Notes (Signed)
2315-Pt did not have IV access on arrival to unit asked pt if IV could be obtained pt refused. Pt stated, "Did not want a needle in my arm."  0030- Pt got out of bed stated needed to "pee". Explained to pt that a foley catheter is in place to collect any urine. Staff safely placed pt back in bed.  Pt has been getting out of bed all shift multiple times and tryin to pull foley catheter out. Pt is very combative and confused. Safety mitts placed on pt.  0130-Staff back in room with pt. Pt stated needed to go to the bathroom.   53- Nurse at bedside sitting with pt for safety. Tech to sit with pt for the duration of shift.

## 2020-04-15 NOTE — Progress Notes (Signed)
PT Cancellation Note  Patient Details Name: Christy Mccann MRN: [id number] DOB: [birth year]   Cancelled Treatment:     Pt scheduled for procedure this date with IR (drain placement). Will hold eval at this time, attempt to evaluate again at later date/time.   2:17 PM, 04/15/20 Etta Grandchild, PT, DPT Physical Therapist - [facility]  [phone] ([place])    [place] C 04/15/2020, 2:17 PM

## 2020-04-15 NOTE — Consult Note (Signed)
Urology Consult Follow Up  Subjective: Lethargic this morning; confusion noted overnight Creatinine increased 5.6  Anti-infectives: Anti-infectives (From admission, onward)   Start     Dose/Rate Route Frequency Ordered Stop   04/13/20 1400  cefTRIAXone (ROCEPHIN) 1 g in sodium chloride 0.9 % 100 mL IVPB  Status:  Discontinued        1 g 200 mL/hr over 30 Minutes Intravenous Every 24 hours 04/13/20 1328 04/14/20 1847      Current Facility-Administered Medications  Medication Dose Route Frequency Provider Last Rate Last Admin  . 0.9 %  sodium chloride infusion   Intravenous Continuous Loletha Grayer, MD 40 mL/hr at 04/14/20 1852 New Bag at 04/14/20 1852  . acetaminophen (TYLENOL) tablet 650 mg  650 mg Oral Q6H PRN Mansy, Jan A, MD   650 mg at 04/14/20 2004   Or  . acetaminophen (TYLENOL) suppository 650 mg  650 mg Rectal Q6H PRN Mansy, Jan A, MD      . amLODipine (NORVASC) tablet 5 mg  5 mg Oral Daily Loletha Grayer, MD   5 mg at 04/15/20 0915  . bisacodyl (DULCOLAX) suppository 10 mg  10 mg Rectal Daily Loletha Grayer, MD   10 mg at 04/13/20 1043  . calcium gluconate 1 g/ 50 mL sodium chloride IVPB  1 g Intravenous Once Loletha Grayer, MD      . Chlorhexidine Gluconate Cloth 2 % PADS 6 each  6 each Topical Daily Loletha Grayer, MD   6 each at 04/15/20 [phone]  . citalopram (CELEXA) tablet 20 mg  20 mg Oral Daily Loletha Grayer, MD   20 mg at 04/15/20 0914  . famotidine (PEPCID) tablet 10 mg  10 mg Oral QHS Loletha Grayer, MD   10 mg at 04/13/20 2146  . hydrALAZINE (APRESOLINE) tablet 100 mg  100 mg Oral TID Loletha Grayer, MD   100 mg at 04/15/20 0913  . labetalol (NORMODYNE) injection 20 mg  20 mg Intravenous Q3H PRN Mansy, Jan A, MD      . lactulose (CHRONULAC) 10 GM/15ML solution 30 g  30 g Oral Daily Loletha Grayer, MD   30 g at 04/13/20 1455  . latanoprost (XALATAN) 0.005 % ophthalmic solution 1 drop  1 drop Both Eyes QHS Wieting, Richard, MD      . lidocaine  (LIDODERM) 5 % 1 patch  1 patch Transdermal Q24H Loletha Grayer, MD   1 patch at 04/14/20 1818  . magnesium hydroxide (MILK OF MAGNESIA) suspension 30 mL  30 mL Oral Daily PRN Mansy, Jan A, MD      . metoprolol succinate (TOPROL-XL) 24 hr tablet 25 mg  25 mg Oral Daily Loletha Grayer, MD   25 mg at 04/15/20 0914  . metoprolol tartrate (LOPRESSOR) injection 2.5 mg  2.5 mg Intravenous Q6H PRN Mansy, Jan A, MD   2.5 mg at 04/13/20 2349  . OLANZapine (ZYPREXA) tablet 5 mg  5 mg Oral QHS Loletha Grayer, MD   5 mg at 04/14/20 2204  . ondansetron (ZOFRAN) tablet 4 mg  4 mg Oral Q6H PRN Mansy, Jan A, MD       Or  . ondansetron [facility]) injection 4 mg  4 mg Intravenous Q6H PRN Mansy, Jan A, MD   4 mg at 04/13/20 2343  . pantoprazole (PROTONIX) EC tablet 40 mg  40 mg Oral BID Loletha Grayer, MD   40 mg at 04/15/20 0915  . [START ON 04/16/2020] pneumococcal 23 valent vaccine (PNEUMOVAX-23) injection 0.5 mL  0.5 mL  Intramuscular Tomorrow-1000 Wieting, Richard, MD      . polyethylene glycol (MIRALAX / GLYCOLAX) packet 17 g  17 g Oral Daily Wieting, Richard, MD      . polyvinyl alcohol (LIQUIFILM TEARS) 1.4 % ophthalmic solution 1-2 drop  1-2 drop Both Eyes BID PRN Wieting, Richard, MD      . sodium phosphate (FLEET) 7-19 GM/118ML enema 1 enema  1 enema Rectal QHS Wieting, Richard, MD      . sodium zirconium cyclosilicate (LOKELMA) packet 10 g  10 g Oral BID Wieting, Richard, MD      . timolol (TIMOPTIC) 0.5 % ophthalmic solution 1 drop  1 drop Both Eyes q AM Wieting, Richard, MD      . traZODone (DESYREL) tablet 100 mg  100 mg Oral QHS Loletha Grayer, MD   100 mg at 04/14/20 2204  . traZODone (DESYREL) tablet 25 mg  25 mg Oral QHS PRN Mansy, Jan A, MD         Objective: Vital signs in last 24 hours: Temp:  [97.6 F (36.4 C)-99.1 F (37.3 C)] 98.6 F (37 C) (08/20 0851) Pulse Rate:  [83-115] 108 (08/20 0851) Resp:  [12-29] 18 (08/20 0525) BP: (119-173)/(39-105) 120/104 (08/20 0851) SpO2:  [97  %-100 %] 100 % (08/20 0851) Weight:  [45 kg-58.1 kg] 50.8 kg (08/19 2309)  Intake/Output from previous day: No intake/output data recorded. Intake/Output this shift: No intake/output data recorded.   Physical Exam: Somnolent  Lab Results:  Recent Labs    04/14/20 0437 04/15/20 0525  WBC 7.7 11.5*  HGB 7.7* 8.3*  HCT 23.8* 28.4*  PLT 253 268   BMET Recent Labs    04/14/20 0437 04/15/20 0525  NA 144 141  K 4.6 5.3*  CL 112* 111  CO2 22 13*  GLUCOSE 108* 117*  BUN 45* 46*  CREATININE 4.41* 5.62*  CALCIUM 9.4 9.5   PT/INR No results for input(s): LABPROT, INR in the last 72 hours. ABG No results for input(s): PHART, HCO3 in the last 72 hours.  Invalid input(s): PCO2, PO2  Studies/Results: DG OR UROLOGY CYSTO IMAGE ([facility] ONLY)  Result Date: 04/14/2020 There is no interpretation for this exam.  This order is for images obtained during a surgical procedure.  Please See "Surgeries" Tab for more information regarding the procedure.     Assessment/recommendation:  s/p Procedure(s): CYSTOSCOPY TRANSURETHRAL RESECTION OF BLADDER TUMOR (TURBT)  1.  Bladder tumor  Large nodular, solid bladder tumor invading trigone and suspicious for muscle invasive disease-pathology pending  Hydronephrosis secondary to above; ureteral orifices unable to be identified intraoperatively  2.  Bilateral hydronephrosis  Secondary to distal ureteral obstruction  Discussed percutaneous nephrostomy placement with her son Luci Bank and he would like to proceed  Discussed neph tube placement with IR    LOS: 2 days    Abbie Sons 04/15/2020

## 2020-04-16 DIAGNOSIS — D6869 Other thrombophilia: Secondary | ICD-10-CM

## 2020-04-16 DIAGNOSIS — N170 Acute kidney failure with tubular necrosis: Secondary | ICD-10-CM

## 2020-04-16 LAB — BASIC METABOLIC PANEL
Anion gap: 16 — ABNORMAL HIGH (ref 5–15)
BUN: 56 mg/dL — ABNORMAL HIGH (ref 8–23)
CO2: 16 mmol/L — ABNORMAL LOW (ref 22–32)
Calcium: 9.3 mg/dL (ref 8.9–10.3)
Chloride: 112 mmol/L — ABNORMAL HIGH (ref 98–111)
Creatinine, Ser: 6.69 mg/dL — ABNORMAL HIGH (ref 0.44–1.00)
GFR calc Af Amer: 6 mL/min — ABNORMAL LOW (ref >60)
GFR calc non Af Amer: 5 mL/min — ABNORMAL LOW (ref >60)
Glucose, Bld: 77 mg/dL (ref 70–99)
Potassium: 4.8 mmol/L (ref 3.5–5.1)
Sodium: 144 mmol/L (ref 135–145)

## 2020-04-16 MED ORDER — AMLODIPINE BESYLATE 5 MG PO TABS
2.5000 mg | ORAL_TABLET | Freq: Every day | ORAL | Status: DC
  Administered 2020-04-17 – 2020-04-25 (×9): 2.5 mg via ORAL
  Filled 2020-04-16 (×9): qty 1

## 2020-04-16 MED ORDER — CYANOCOBALAMIN 500 MCG PO TABS
250.0000 ug | ORAL_TABLET | Freq: Every day | ORAL | Status: DC
  Administered 2020-04-16 – 2020-04-25 (×10): 250 ug via ORAL
  Filled 2020-04-16 (×11): qty 1

## 2020-04-16 MED ORDER — HYDRALAZINE HCL 50 MG PO TABS
50.0000 mg | ORAL_TABLET | Freq: Three times a day (TID) | ORAL | Status: DC
  Administered 2020-04-16 – 2020-04-25 (×27): 50 mg via ORAL
  Filled 2020-04-16 (×27): qty 1

## 2020-04-16 NOTE — Progress Notes (Signed)
Patient ID: Christy Mccann, female   DOB: 1931/03/03, 86 y.o.   MRN: [id number] [facility] PROGRESS NOTE  Christy Mccann BJS:[id number] DOB: February 06, 1931 DOA: 04/13/2020 PCP: [facility]  HPI/Subjective: The patient is more alert today and was sitting up eating breakfast when I saw her.  She had no complaints.  She understood that she had a procedure yesterday.  Still waiting for biopsy results of the bladder mass.  Objective: Vitals:   04/16/20 0758 04/16/20 1150  BP: 122/76 115/65  Pulse: 79 86  Resp: 17 16  Temp: 97.8 F (36.6 C) 97.7 F (36.5 C)  SpO2: 100% 99%    Intake/Output Summary (Last 24 hours) at 04/16/2020 1237 Last data filed at 04/16/2020 0646 Gross per 24 hour  Intake 895.34 ml  Output 1150 ml  Net -254.66 ml   Filed Weights   04/14/20 1032 04/14/20 1726 04/14/20 2309  Weight: 45 kg 58.1 kg 50.8 kg    ROS: Review of Systems  Respiratory: Negative for shortness of breath.   Cardiovascular: Negative for chest pain.  Gastrointestinal: Negative for abdominal pain, nausea and vomiting.   Exam: Physical Exam HENT:     Nose: No mucosal edema.     Mouth/Throat:     Pharynx: No oropharyngeal exudate.  Eyes:     General: Lids are normal.     Conjunctiva/sclera: Conjunctivae normal.  Cardiovascular:     Rate and Rhythm: Normal rate and regular rhythm.     Heart sounds: Normal heart sounds, S1 normal and S2 normal.  Pulmonary:     Breath sounds: No decreased breath sounds, wheezing, rhonchi or rales.  Abdominal:     Palpations: Abdomen is soft.     Tenderness: There is no abdominal tenderness.  Musculoskeletal:     Right lower leg: No swelling.     Left lower leg: No swelling.  Skin:    General: Skin is warm.     Findings: No rash.  Neurological:     Mental Status: She is alert.     Comments: More alert today and more talkative today than the past 2 days.       Data Reviewed: Basic Metabolic Panel: Recent Labs  Lab 04/12/20 1608  04/13/20 0223 04/14/20 0437 04/15/20 0525 04/16/20 0525  NA 139 139 144 141 144  K 5.1 4.2 4.6 5.3* 4.8  CL 100 101 112* 111 112*  CO2 25 24 22  13* 16*  GLUCOSE 125* 135* 108* 117* 77  BUN 52* 52* 45* 46* 56*  CREATININE 4.81* 4.47* 4.41* 5.62* 6.69*  CALCIUM 9.8 10.5* 9.4 9.5 9.3   Liver Function Tests: Recent Labs  Lab 04/13/20 0223  AST 20  ALT 11  ALKPHOS 72  BILITOT 0.6  PROT 7.3  ALBUMIN 4.1   CBC: Recent Labs  Lab 04/12/20 1608 04/12/20 1608 04/13/20 0223 04/13/20 0714 04/13/20 1915 04/14/20 0437 04/15/20 0525  WBC 8.9  --  10.8*  --   --  7.7 11.5*  NEUTROABS  --   --  8.0*  --   --   --   --   HGB 8.9*   < > 9.1* 7.7* 9.1* 7.7* 8.3*  HCT 28.4*   < > 27.5* 24.7* 28.5* 23.8* 28.4*  MCV 105.6*  --  102.6*  --   --  103.5* 113.6*  PLT 305  --  314  --   --  253 268   < > = values in this interval not displayed.  CBG: Recent Labs  Lab 04/15/20 1028  GLUCAP 141*    Recent Results (from the past 240 hour(s))  SARS Coronavirus 2 by RT PCR (hospital order, performed in [facility] hospital lab) Nasopharyngeal Nasopharyngeal Swab     Status: None   Collection Time: 04/13/20  2:23 AM   Specimen: Nasopharyngeal Swab  Result Value Ref Range Status   SARS Coronavirus 2 NEGATIVE NEGATIVE Final    Comment: (NOTE) SARS-CoV-2 target nucleic acids are NOT DETECTED.  The SARS-CoV-2 RNA is generally detectable in upper and lower respiratory specimens during the acute phase of infection. The lowest concentration of SARS-CoV-2 viral copies this assay can detect is 250 copies / mL. A negative result does not preclude SARS-CoV-2 infection and should not be used as the sole basis for treatment or other patient management decisions.  A negative result may occur with improper specimen collection / handling, submission of specimen other than nasopharyngeal swab, presence of viral mutation(s) within the areas targeted by this assay, and inadequate number of viral  copies (<250 copies / mL). A negative result must be combined with clinical observations, patient history, and epidemiological information.  Fact Sheet for Patients:   StrictlyIdeas.no  Fact Sheet for Healthcare Providers: BankingDealers.co.za  This test is not yet approved or  cleared by the [place] FDA and has been authorized for detection and/or diagnosis of SARS-CoV-2 by FDA under an Emergency Use Authorization (EUA).  This EUA will remain in effect (meaning this test can be used) for the duration of the COVID-19 declaration under Section 564(b)(1) of the Act, 21 U.S.C. section 360bbb-3(b)(1), unless the authorization is terminated or revoked sooner.  Performed at [facility], [address], [place], [place] [address]   Urine culture     Status: Abnormal   Collection Time: 04/13/20  2:23 AM   Specimen: Urine, Random  Result Value Ref Range Status   Specimen Description   Final    URINE, RANDOM Performed at [facility], [address] [place], [place] [address]    Special Requests   Final    NONE Performed at [facility], [address] [place], [place] [address]    Culture (A)  Final    20,000 COLONIES/mL MULTIPLE SPECIES PRESENT, SUGGEST RECOLLECTION   Report Status 04/14/2020 FINAL  Final     Studies: DG OR UROLOGY CYSTO IMAGE ([place])  Result Date: 04/14/2020 There is no interpretation for this exam.  This order is for images obtained during a surgical procedure.  Please See "Surgeries" Tab for more information regarding the procedure.   IR NEPHROSTOMY PLACEMENT LEFT  Result Date: 04/15/2020 INDICATION: Bilateral hydronephrosis secondary to distal ureteral obstruction due to bladder tumor. Associated renal failure. EXAM: BILATERAL PERCUTANEOUS NEPHROSTOMY TUBE PLACEMENT COMPARISON:  CT of the abdomen and pelvis without contrast on 04/13/2020 MEDICATIONS: 2 g IV Ancef; The  antibiotic was administered in an appropriate time frame prior to skin puncture. ANESTHESIA/SEDATION: The patient was not given conscious sedation due to relatively up tended state. CONTRAST:  25 mL Visipaque 320-administered into the collecting system(s) FLUOROSCOPY TIME:  Fluoroscopy Time: 4 minutes and 30 seconds. 31.5 mGy. COMPLICATIONS: None immediate. PROCEDURE: Informed written consent was obtained from the patient after a thorough discussion of the procedural risks, benefits and alternatives. All questions were addressed. Maximal Sterile Barrier Technique was utilized including caps, mask, sterile gowns, sterile gloves, sterile drape, hand hygiene and skin antiseptic. A timeout was performed prior to the initiation of the procedure. Ultrasound  was used to localize the kidneys. Under direct ultrasound guidance, both renal collecting systems were punctured with a 21 gauge needles and accessed with transitional dilators. Over guidewires, access was dilated to 10 [place] and 10 [place] percutaneous nephrostomy tubes placed. Catheter position was confirmed by fluoroscopy after contrast injection. Both catheters were attached to gravity drainage bags and secured at the skin with Prolene retention sutures and StatLock devices. FINDINGS: Both kidneys demonstrate severe hydronephrosis. Catheters were formed at the level of the renal pelvis bilaterally and are draining urine. The left-sided catheter does extend partially into the proximal ureter. IMPRESSION: Placement of bilateral percutaneous nephrostomy tubes. Bilateral 10 French catheters were attached to gravity bag drainage. Electronically Signed   By: Aletta Edouard M.D.   On: 04/15/2020 16:28   IR NEPHROSTOMY PLACEMENT RIGHT  Result Date: 04/15/2020 INDICATION: Bilateral hydronephrosis secondary to distal ureteral obstruction due to bladder tumor. Associated renal failure. EXAM: BILATERAL PERCUTANEOUS NEPHROSTOMY TUBE PLACEMENT COMPARISON:  CT of the abdomen  and pelvis without contrast on 04/13/2020 MEDICATIONS: 2 g IV Ancef; The antibiotic was administered in an appropriate time frame prior to skin puncture. ANESTHESIA/SEDATION: The patient was not given conscious sedation due to relatively up tended state. CONTRAST:  25 mL Visipaque 320-administered into the collecting system(s) FLUOROSCOPY TIME:  Fluoroscopy Time: 4 minutes and 30 seconds. 31.5 mGy. COMPLICATIONS: None immediate. PROCEDURE: Informed written consent was obtained from the patient after a thorough discussion of the procedural risks, benefits and alternatives. All questions were addressed. Maximal Sterile Barrier Technique was utilized including caps, mask, sterile gowns, sterile gloves, sterile drape, hand hygiene and skin antiseptic. A timeout was performed prior to the initiation of the procedure. Ultrasound was used to localize the kidneys. Under direct ultrasound guidance, both renal collecting systems were punctured with a 21 gauge needles and accessed with transitional dilators. Over guidewires, access was dilated to 10 [place] and 10 [place] percutaneous nephrostomy tubes placed. Catheter position was confirmed by fluoroscopy after contrast injection. Both catheters were attached to gravity drainage bags and secured at the skin with Prolene retention sutures and StatLock devices. FINDINGS: Both kidneys demonstrate severe hydronephrosis. Catheters were formed at the level of the renal pelvis bilaterally and are draining urine. The left-sided catheter does extend partially into the proximal ureter. IMPRESSION: Placement of bilateral percutaneous nephrostomy tubes. Bilateral 10 French catheters were attached to gravity bag drainage. Electronically Signed   By: Aletta Edouard M.D.   On: 04/15/2020 16:28    Scheduled Meds: . amLODipine  5 mg Oral Daily  . Chlorhexidine Gluconate Cloth  6 each Topical Daily  . citalopram  20 mg Oral Daily  . famotidine  10 mg Oral QHS  . hydrALAZINE  100 mg Oral  TID  . lactulose  30 g Oral Daily  . latanoprost  1 drop Both Eyes QHS  . lidocaine  1 patch Transdermal Q24H  . metoprolol succinate  25 mg Oral Daily  . OLANZapine  5 mg Oral QHS  . pantoprazole  40 mg Oral BID  . pneumococcal 23 valent vaccine  0.5 mL Intramuscular Tomorrow-1000  . polyethylene glycol  17 g Oral Daily  . sodium chloride flush  5 mL Intracatheter Q12H  . sodium phosphate  1 enema Rectal QHS  . sodium zirconium cyclosilicate  10 g Oral BID  . timolol  1 drop Both Eyes q AM  . traZODone  100 mg Oral QHS   Continuous Infusions: . sodium chloride 40 mL/hr at 04/16/20 [phone]  Assessment/Plan:  1. Acute kidney injury with bilateral hydronephrosis.  Cystoscopy did show a large bladder mass and pathology results still pending.  Nephrostomy tubes placed yesterday afternoon.  Creatinine worse today than yesterday but patient's mental status is better.  Continue to monitor creatinine.  Hopefully tomorrow creatinine will improve. 2. Hyperkalemia has improved today.  Continue Lokelma with creatinine this impaired. 3. Acute blood loss anemia with hematuria.  Xarelto now contraindicated.  Monitor hemoglobin. 4. Essential hypertension on Norvasc and hydralazine 5. Paroxysmal atrial fibrillation on low-dose Toprol.  Xarelto now contraindicated with bleeding bladder mass. 6. Acquired thrombophilia secondary to paroxysmal atrial fibrillation 7. Cognitive issues.  B12 low normal.  Normal range.  Continue Zyprexa at night.  Patient's mental status better today. 8. Constipation seen on CT scan.  Patient finally had a bowel movement.  Continue MiraLAX daily.  We will get rid of other constipation medications 9. Large hiatal hernia on PPI     Code Status:     Code Status Orders  (From admission, onward)         Start     Ordered   04/13/20 0617  Full code  Continuous        04/13/20 0623        Code Status History    This patient has a current code status but no historical  code status.   Advance Care Planning Activity    Advance Directive Documentation     Most Recent Value  Type of Advance Directive Out of facility DNR (pink MOST or yellow form)  [see pink sheet]  Pre-existing out of facility DNR order (yellow form or pink MOST form) Pink MOST/Yellow Form most recent copy in chart - Physician notified to receive inpatient order  "MOST" Form in Place? --     Family Communication: Spoke with son on the phone Disposition Plan: Status is: Inpatient   Dispo: The patient is from: Home              Anticipated d/c is to: Potentially rehab              Anticipated d/c date is: Likely will need at least a few days here in the hospital.  Potentially 8/24              Patient currently still has a rising creatinine.  Will like to see creatinine peak and start to come down prior to any disposition.  Consultants:  Urology  Time spent: 26 minutes  [place]

## 2020-04-16 NOTE — Plan of Care (Signed)
1 wk.

## 2020-04-16 NOTE — Consult Note (Signed)
Urology Consult Follow Up  Subjective: No complaints, feeling better Awake and alert today Bilateral nephrostomy tubes placed by IR yesterday  Anti-infectives: Anti-infectives (From admission, onward)   Start     Dose/Rate Route Frequency Ordered Stop   04/15/20 1630  ceFAZolin (ANCEF) IVPB 2g/100 mL premix        2 g 200 mL/hr over 30 Minutes Intravenous  Once 04/15/20 1534 04/15/20 1514   04/13/20 1400  cefTRIAXone (ROCEPHIN) 1 g in sodium chloride 0.9 % 100 mL IVPB  Status:  Discontinued        1 g 200 mL/hr over 30 Minutes Intravenous Every 24 hours 04/13/20 1328 04/14/20 1847      Current Facility-Administered Medications  Medication Dose Route Frequency Provider Last Rate Last Admin  . 0.9 %  sodium chloride infusion   Intravenous Continuous Loletha Grayer, MD 40 mL/hr at 04/16/20 0736 New Bag at 04/16/20 0736  . acetaminophen (TYLENOL) tablet 650 mg  650 mg Oral Q6H PRN Mansy, Jan A, MD   650 mg at 04/14/20 2004   Or  . acetaminophen (TYLENOL) suppository 650 mg  650 mg Rectal Q6H PRN Mansy, Jan A, MD      . Derrill Memo ON 04/17/2020] amLODipine (NORVASC) tablet 2.5 mg  2.5 mg Oral Daily Wieting, Richard, MD      . bisacodyl (DULCOLAX) suppository 10 mg  10 mg Rectal Daily PRN Loletha Grayer, MD      . Chlorhexidine Gluconate Cloth 2 % PADS 6 each  6 each Topical Daily Loletha Grayer, MD   6 each at 04/16/20 [phone]  . citalopram (CELEXA) tablet 20 mg  20 mg Oral Daily Loletha Grayer, MD   20 mg at 04/16/20 0936  . famotidine (PEPCID) tablet 10 mg  10 mg Oral QHS Loletha Grayer, MD   10 mg at 04/15/20 2125  . hydrALAZINE (APRESOLINE) tablet 50 mg  50 mg Oral TID Loletha Grayer, MD   50 mg at 04/16/20 1714  . labetalol (NORMODYNE) injection 20 mg  20 mg Intravenous Q3H PRN Mansy, Jan A, MD      . latanoprost (XALATAN) 0.005 % ophthalmic solution 1 drop  1 drop Both Eyes QHS Loletha Grayer, MD   1 drop at 04/15/20 2144  . lidocaine (LIDODERM) 5 % 1 patch  1 patch  Transdermal Q24H Loletha Grayer, MD   1 patch at 04/16/20 1715  . magnesium hydroxide (MILK OF MAGNESIA) suspension 30 mL  30 mL Oral Daily PRN Mansy, Jan A, MD      . metoprolol succinate (TOPROL-XL) 24 hr tablet 25 mg  25 mg Oral Daily Loletha Grayer, MD   25 mg at 04/16/20 0936  . metoprolol tartrate (LOPRESSOR) injection 2.5 mg  2.5 mg Intravenous Q6H PRN Mansy, Jan A, MD   2.5 mg at 04/13/20 2349  . OLANZapine (ZYPREXA) tablet 5 mg  5 mg Oral QHS Loletha Grayer, MD   5 mg at 04/15/20 2126  . ondansetron (ZOFRAN) tablet 4 mg  4 mg Oral Q6H PRN Mansy, Jan A, MD       Or  . ondansetron [facility]) injection 4 mg  4 mg Intravenous Q6H PRN Mansy, Jan A, MD   4 mg at 04/15/20 1026  . pantoprazole (PROTONIX) EC tablet 40 mg  40 mg Oral BID Loletha Grayer, MD   40 mg at 04/16/20 0936  . pneumococcal 23 valent vaccine (PNEUMOVAX-23) injection 0.5 mL  0.5 mL Intramuscular Tomorrow-1000 Loletha Grayer, MD      .  polyethylene glycol (MIRALAX / GLYCOLAX) packet 17 g  17 g Oral Daily Wieting, Richard, MD      . polyvinyl alcohol (LIQUIFILM TEARS) 1.4 % ophthalmic solution 1-2 drop  1-2 drop Both Eyes BID PRN Wieting, Richard, MD      . sodium chloride flush (NS) 0.9 % injection 5 mL  5 mL Intracatheter Q12H Aletta Edouard, MD   5 mL at 04/16/20 0937  . sodium zirconium cyclosilicate (LOKELMA) packet 10 g  10 g Oral BID Loletha Grayer, MD   10 g at 04/16/20 0936  . timolol (TIMOPTIC) 0.5 % ophthalmic solution 1 drop  1 drop Both Eyes q AM Loletha Grayer, MD   1 drop at 04/16/20 [phone]  . traZODone (DESYREL) tablet 100 mg  100 mg Oral QHS Loletha Grayer, MD   100 mg at 04/15/20 2125  . vitamin B-12 (CYANOCOBALAMIN) tablet 250 mcg  250 mcg Oral Daily Loletha Grayer, MD   250 mcg at 04/16/20 1713     Objective: Vital signs in last 24 hours: Temp:  [97.7 F (36.5 C)-98.1 F (36.7 C)] 98.1 F (36.7 C) (08/21 1617) Pulse Rate:  [79-101] 81 (08/21 1617) Resp:  [16-18] 17 (08/21 1617) BP:  (115-139)/(42-82) 137/42 (08/21 1617) SpO2:  [97 %-100 %] 99 % (08/21 1617)  Intake/Output from previous day: 08/20 0701 - 08/21 0700 In: 895.3 [I.V.:764.1; IV Piggyback:131.2] Out: 1150 [Urine:1150] Intake/Output this shift: No intake/output data recorded.   Physical Exam: Neph tubes draining blood-tinged urine bilaterally.  Foley catheter with small amount of clear urine in tubing  Lab Results:  Recent Labs    04/14/20 0437 04/15/20 0525  WBC 7.7 11.5*  HGB 7.7* 8.3*  HCT 23.8* 28.4*  PLT 253 268   BMET Recent Labs    04/15/20 0525 04/16/20 0525  NA 141 144  K 5.3* 4.8  CL 111 112*  CO2 13* 16*  GLUCOSE 117* 77  BUN 46* 56*  CREATININE 5.62* 6.69*  CALCIUM 9.5 9.3   PT/INR No results for input(s): LABPROT, INR in the last 72 hours. ABG No results for input(s): PHART, HCO3 in the last 72 hours.  Invalid input(s): PCO2, PO2  Studies/Results: IR NEPHROSTOMY PLACEMENT LEFT  Result Date: 04/15/2020 INDICATION: Bilateral hydronephrosis secondary to distal ureteral obstruction due to bladder tumor. Associated renal failure. EXAM: BILATERAL PERCUTANEOUS NEPHROSTOMY TUBE PLACEMENT COMPARISON:  CT of the abdomen and pelvis without contrast on 04/13/2020 MEDICATIONS: 2 g IV Ancef; The antibiotic was administered in an appropriate time frame prior to skin puncture. ANESTHESIA/SEDATION: The patient was not given conscious sedation due to relatively up tended state. CONTRAST:  25 mL Visipaque 320-administered into the collecting system(s) FLUOROSCOPY TIME:  Fluoroscopy Time: 4 minutes and 30 seconds. 31.5 mGy. COMPLICATIONS: None immediate. PROCEDURE: Informed written consent was obtained from the patient after a thorough discussion of the procedural risks, benefits and alternatives. All questions were addressed. Maximal Sterile Barrier Technique was utilized including caps, mask, sterile gowns, sterile gloves, sterile drape, hand hygiene and skin antiseptic. A timeout was  performed prior to the initiation of the procedure. Ultrasound was used to localize the kidneys. Under direct ultrasound guidance, both renal collecting systems were punctured with a 21 gauge needles and accessed with transitional dilators. Over guidewires, access was dilated to 10 [place] and 10 [place] percutaneous nephrostomy tubes placed. Catheter position was confirmed by fluoroscopy after contrast injection. Both catheters were attached to gravity drainage bags and secured at the skin with Prolene retention sutures and StatLock devices. FINDINGS:  Both kidneys demonstrate severe hydronephrosis. Catheters were formed at the level of the renal pelvis bilaterally and are draining urine. The left-sided catheter does extend partially into the proximal ureter. IMPRESSION: Placement of bilateral percutaneous nephrostomy tubes. Bilateral 10 French catheters were attached to gravity bag drainage. Electronically Signed   By: Aletta Edouard M.D.   On: 04/15/2020 16:28   IR NEPHROSTOMY PLACEMENT RIGHT  Result Date: 04/15/2020 INDICATION: Bilateral hydronephrosis secondary to distal ureteral obstruction due to bladder tumor. Associated renal failure. EXAM: BILATERAL PERCUTANEOUS NEPHROSTOMY TUBE PLACEMENT COMPARISON:  CT of the abdomen and pelvis without contrast on 04/13/2020 MEDICATIONS: 2 g IV Ancef; The antibiotic was administered in an appropriate time frame prior to skin puncture. ANESTHESIA/SEDATION: The patient was not given conscious sedation due to relatively up tended state. CONTRAST:  25 mL Visipaque 320-administered into the collecting system(s) FLUOROSCOPY TIME:  Fluoroscopy Time: 4 minutes and 30 seconds. 31.5 mGy. COMPLICATIONS: None immediate. PROCEDURE: Informed written consent was obtained from the patient after a thorough discussion of the procedural risks, benefits and alternatives. All questions were addressed. Maximal Sterile Barrier Technique was utilized including caps, mask, sterile gowns,  sterile gloves, sterile drape, hand hygiene and skin antiseptic. A timeout was performed prior to the initiation of the procedure. Ultrasound was used to localize the kidneys. Under direct ultrasound guidance, both renal collecting systems were punctured with a 21 gauge needles and accessed with transitional dilators. Over guidewires, access was dilated to 10 [place] and 10 [place] percutaneous nephrostomy tubes placed. Catheter position was confirmed by fluoroscopy after contrast injection. Both catheters were attached to gravity drainage bags and secured at the skin with Prolene retention sutures and StatLock devices. FINDINGS: Both kidneys demonstrate severe hydronephrosis. Catheters were formed at the level of the renal pelvis bilaterally and are draining urine. The left-sided catheter does extend partially into the proximal ureter. IMPRESSION: Placement of bilateral percutaneous nephrostomy tubes. Bilateral 10 French catheters were attached to gravity bag drainage. Electronically Signed   By: Aletta Edouard M.D.   On: 04/15/2020 16:28     Assessment: s/p Procedure(s): CYSTOSCOPY TRANSURETHRAL RESECTION OF BLADDER TUMOR (TURBT)   Bladder pathology pending  Status post bilateral nephrostomy tube placement by IR, creatinine higher this morning however would expect it to start downtrending  Her Foley urine is clear and the majority of her urine output will be via nephrostomies.  Recommendation:  DC Foley  Medical oncology consult once pathology returns    LOS: 3 days    Ronda Fairly [facility] 04/16/2020

## 2020-04-16 NOTE — Evaluation (Signed)
Occupational Therapy Evaluation Patient Details Name: Christy Mccann MRN: [id number] DOB: [birth year] Today's Date: 04/16/2020    History of Present Illness Christy Mccann  is a 86 y.o. slim Caucasian female with a known history of atrial fibrillation, depression, hypertension and hypothyroidism, who presented to the emergency room with acute onset of hematuria which has been intermittent over the last week.  The patient has been on Eliquis for several months for her atrial fibrillation.  She has been having occasional mild fatigue and tiredness.  She denies any abdominal or flank pain.  No fever or chills.  She has been having headache without dizziness or blurred vision.  No chest pain or dyspnea or cough or wheezing or hemoptysis.  No other bleeding diathesis. Had yesterday 2 nephro tubes placed.   Clinical Impression   Christy Mccann S/P surgery with the diagnosis of bilateral stone. Patient agrees to PT and OT evaluation. She has numerous lines and drains to manage. She lives by herself using [facility] but plan to stay with sons after hospitalization. Report 2/10 pain in R UE with swelling around the elbow - did had ice on it last night per nsg - nsg aware - pt ed on elevation and light massage. Pt able to perform bed mobility with Min and assist to manage the lines and drains. Her AROM for UE and LE is [facility] and her strength is [facility]. Able to sit at EOB independent performing LB dressing with no LOB.  And  standing static and dynamic need UE support on RW. She show RR to 36 with standing activity simulating LB dressing and clothing management. Pt can benefit from OT services to decrease edema, and pain in R UE , increase independence in LB ADL's and functional mobility in ADL's.  Follow Up Recommendations  Home health OT    Equipment Recommendations       Recommendations for Other Services       Precautions / Restrictions Precautions Precautions: Fall Precaution Comments: Multiple lines and  draines Restrictions Weight Bearing Restrictions: No      Mobility Bed Mobility Overal bed mobility: Independent;Modified Independent             General bed mobility comments: needs assistance with all drains and lines  Transfers Overall transfer level: Modified independent Equipment used: Rolling walker (2 wheeled)             General transfer comment: min A with verbal cues for UE placements    Balance Overall balance assessment: Needs assistance Sitting-balance support: No upper extremity supported;Feet supported Sitting balance-Leahy Scale: Good     Standing balance support: Bilateral upper extremity supported Standing balance-Leahy Scale: Fair                             ADL either performed or assessed with clinical judgement   ADL Overall ADL's : Needs assistance/impaired Eating/Feeding: Independent   Grooming: Independent   Upper Body Bathing: Set up;Independent   Lower Body Bathing: Independent;Set up   Upper Body Dressing : Minimal assistance;Set up;Independent   Lower Body Dressing: Minimal assistance;Set up   Toilet Transfer: Min Psychiatric nurse Details (indicate cue type and reason): 2 drains and catheter     Tub/ Shower Transfer: Moderate assistance Tub/Shower Transfer Details (indicate cue type and reason): because of drains Functional mobility during ADLs: Minimal assistance       Vision Baseline Vision/History: Wears glasses Wears Glasses: Reading only Patient Visual  Report: No change from baseline       Perception     Praxis      Pertinent Vitals/Pain Pain Assessment: 0-10 Pain Score: 2  Pain Location: R UE - swelling around elbow - nsg made aware- and said they are aware of it Pain Descriptors / Indicators: Aching;Tender Pain Intervention(s): Monitored during session;Repositioned (elevated on pillow)     Hand Dominance     Extremity/Trunk Assessment Upper Extremity Assessment Upper Extremity  Assessment: Overall WFL for tasks assessed (strength appear 4/5 - tenderness, swelling and pain at elbow- nsg aware)   Lower Extremity Assessment Lower Extremity Assessment: Overall WFL for tasks assessed       Communication Communication Communication: No difficulties   Cognition Arousal/Alertness: Awake/alert Behavior During Therapy: WFL for tasks assessed/performed Overall Cognitive Status: Within Functional Limits for tasks assessed                                 General Comments:  (Pt is unaware that she has stool on her bottom)   General Comments       Exercises     Shoulder Instructions      Home Living Family/patient expects to be discharged to:: Private residence Living Arrangements: Children (2 sons close by) Available Help at Discharge: Family   Home Access: Level entry     Home Layout: One level     Bathroom Shower/Tub: Astronomer Accessibility: Yes   Home Equipment: Environmental consultant - 2 wheels   Additional Comments: use can at home, has rails in bathroom , and seat      Prior Functioning/Environment Level of Independence: Independent with assistive device(s)                 OT Problem List: Decreased strength;Increased edema;Decreased activity tolerance;Impaired UE functional use;Impaired balance (sitting and/or standing);Pain      OT Treatment/Interventions: Self-care/ADL training;Manual therapy;Therapeutic exercise;Balance training    OT Goals(Current goals can be found in the care plan section) Acute Rehab OT Goals Patient Stated Goal: To go home with son OT Goal Formulation: With patient Time For Goal Achievement: 04/23/20 Potential to Achieve Goals: Good  OT Frequency: Min 2X/week   Barriers to D/C:            Co-evaluation PT/OT/SLP Co-Evaluation/Treatment: Yes Reason for Co-Treatment: Complexity of the patient's impairments (multi-system involvement) PT goals addressed during session: Mobility/safety  with mobility;Proper use of DME OT goals addressed during session: ADL's and self-care      AM-PAC OT "6 Clicks" Daily Activity     Outcome Measure Help from another person eating meals?: None Help from another person taking care of personal grooming?: None Help from another person toileting, which includes using toliet, bedpan, or urinal?: A Lot Help from another person bathing (including washing, rinsing, drying)?: A Little Help from another person to put on and taking off regular upper body clothing?: None Help from another person to put on and taking off regular lower body clothing?: A Little 6 Click Score: 20   End of Session Equipment Utilized During Treatment: Gait belt Nurse Communication:  (pain and swelling in R UE - did do ice per nsg and was aware)  Activity Tolerance: Patient tolerated treatment well Patient left: in bed;with call bell/phone within reach  OT Visit Diagnosis: Muscle weakness (generalized) (M62.81);Pain Pain - Right/Left: Right Pain - part of body: Arm  Time: 1694-5038 OT Time Calculation (min): 39 min Charges:  OT Evaluation $OT Eval Low Complexity: 1 Low    Christy Mccann OTR/L,CLT 04/16/2020, 2:08 PM

## 2020-04-16 NOTE — Progress Notes (Signed)
Physical Therapy Evaluation Patient Details Name: Christy Mccann MRN: [id number] DOB: 12-Oct-1930 Today's Date: 04/16/2020   History of Present Illness  Per MD note:Christy Mccann  is a 86 y.o. slim Caucasian female with a known history of atrial fibrillation, depression, hypertension and hypothyroidism, who presented to the emergency room with acute onset of hematuria which has been intermittent over the last week.  The patient has been on Eliquis for several months for her atrial fibrillation.  She has been having occasional mild fatigue and tiredness.  She denies any abdominal or flank pain.  No fever or chills.  She has been having headache without dizziness or blurred vision.  No chest pain or dyspnea or cough or wheezing or hemoptysis.  No other bleeding diathesis  Clinical Impression   [name] is S/P surgery with the diagnosis of bilateral stone. Patient agrees to PT and OT evaluation. She has numerous lines and drains to manage. She has 2 sons and lives with one son and uses spc for ambulation. She is able to perform bed mobility with MI and assist to manage the lines and drains. Her ROM for UE and LE is [facility] and her strength is [facility]. Her seated balance is good without UE support, standing static is good with RW for support with UE's supported. She has decreased tolerance to static standing with increased RR to 36. She is able to march in place and stand for several minutes while she is cleaned up from stool on her bottom. She has swollen RUE arm and is educated by OT for elevation and edema management. She is repositioned in bed and given call bell.     Follow Up Recommendations SNF    Equipment Recommendations  Rolling walker with 5" wheels    Recommendations for Other Services       Precautions / Restrictions Restrictions Weight Bearing Restrictions: No      Mobility  Bed Mobility Overal bed mobility: Modified Independent                Transfers Overall transfer level:  Modified independent Equipment used: Rolling walker (2 wheeled)             General transfer comment: needs cues for UE placaements  Ambulation/Gait Ambulation/Gait assistance:  (unable due to increased RR 36 standing)              Stairs            Wheelchair Mobility    Modified Rankin (Stroke Patients Only)       Balance Overall balance assessment: Needs assistance Sitting-balance support: No upper extremity supported;Feet supported Sitting balance-Leahy Scale: Good     Standing balance support: Bilateral upper extremity supported Standing balance-Leahy Scale: Fair                               Pertinent Vitals/Pain Pain Assessment: No/denies pain    Home Living Family/patient expects to be discharged to:: Private residence Living Arrangements: Children Available Help at Discharge: Family   Home Access: Level entry       Home Equipment: Environmental consultant - 2 wheels      Prior Function Level of Independence: Independent with assistive device(s)               Hand Dominance        Extremity/Trunk Assessment   Upper Extremity Assessment Upper Extremity Assessment: Overall WFL for tasks assessed  Lower Extremity Assessment Lower Extremity Assessment: Overall WFL for tasks assessed       Communication   Communication: No difficulties  Cognition Arousal/Alertness: Awake/alert Behavior During Therapy: WFL for tasks assessed/performed Overall Cognitive Status: Within Functional Limits for tasks assessed                                 General Comments:  (Pt is unaware that she has stool on her bottom)      General Comments      Exercises     Assessment/Plan    PT Assessment Patient needs continued PT services  PT Problem List Decreased strength;Decreased balance;Decreased mobility;Decreased coordination       PT Treatment Interventions Gait training;Functional mobility training;Therapeutic  activities;Therapeutic exercise;Balance training    PT Goals (Current goals can be found in the Care Plan section)  Acute Rehab PT Goals Patient Stated Goal: to walk PT Goal Formulation: Patient unable to participate in goal setting Time For Goal Achievement: 04/30/20 Potential to Achieve Goals: Good    Frequency Min 2X/week   Barriers to discharge        Co-evaluation PT/OT/SLP Co-Evaluation/Treatment: Yes Reason for Co-Treatment: Complexity of the patient's impairments (multi-system involvement);For patient/therapist safety;To address functional/ADL transfers PT goals addressed during session: Mobility/safety with mobility;Proper use of DME OT goals addressed during session: ADL's and self-care;Proper use of Adaptive equipment and DME       AM-PAC PT "6 Clicks" Mobility  Outcome Measure Help needed turning from your back to your side while in a flat bed without using bedrails?: A Little Help needed moving from lying on your back to sitting on the side of a flat bed without using bedrails?: A Little Help needed moving to and from a bed to a chair (including a wheelchair)?: A Lot Help needed standing up from a chair using your arms (e.g., wheelchair or bedside chair)?: A Little Help needed to walk in hospital room?: A Lot Help needed climbing 3-5 steps with a railing? : A Lot 6 Click Score: 15    End of Session Equipment Utilized During Treatment: Gait belt Activity Tolerance: Treatment limited secondary to medical complications (Comment) Patient left: in bed;with bed alarm set Nurse Communication: Mobility status PT Visit Diagnosis: Unsteadiness on feet (R26.81);Muscle weakness (generalized) (M62.81);Difficulty in walking, not elsewhere classified (R26.2)    Time: 1055-1110 PT Time Calculation (min) (ACUTE ONLY): 15 min   Charges:   PT Evaluation $PT Eval Low Complexity: 1 Low PT Treatments $Therapeutic Activity: 8-22 mins          Alanson Puls, PT  DPT 04/16/2020, 11:42 AM

## 2020-04-17 LAB — BASIC METABOLIC PANEL
Anion gap: 15 (ref 5–15)
BUN: 51 mg/dL — ABNORMAL HIGH (ref 8–23)
CO2: 17 mmol/L — ABNORMAL LOW (ref 22–32)
Calcium: 9 mg/dL (ref 8.9–10.3)
Chloride: 108 mmol/L (ref 98–111)
Creatinine, Ser: 6.19 mg/dL — ABNORMAL HIGH (ref 0.44–1.00)
GFR calc Af Amer: 6 mL/min — ABNORMAL LOW (ref >60)
GFR calc non Af Amer: 6 mL/min — ABNORMAL LOW (ref >60)
Glucose, Bld: 95 mg/dL (ref 70–99)
Potassium: 3 mmol/L — ABNORMAL LOW (ref 3.5–5.1)
Sodium: 140 mmol/L (ref 135–145)

## 2020-04-17 LAB — CBC
HCT: 25.1 % — ABNORMAL LOW (ref 36.0–46.0)
Hemoglobin: 8 g/dL — ABNORMAL LOW (ref 12.0–15.0)
MCH: 33.9 pg (ref 26.0–34.0)
MCHC: 31.9 g/dL (ref 30.0–36.0)
MCV: 106.4 fL — ABNORMAL HIGH (ref 80.0–100.0)
Platelets: 215 10*3/uL (ref 150–400)
RBC: 2.36 MIL/uL — ABNORMAL LOW (ref 3.87–5.11)
RDW: 14.5 % (ref 11.5–15.5)
WBC: 11.4 10*3/uL — ABNORMAL HIGH (ref 4.0–10.5)
nRBC: 0 % (ref 0.0–0.2)

## 2020-04-17 MED ORDER — OLANZAPINE 5 MG PO TABS
5.0000 mg | ORAL_TABLET | Freq: Every evening | ORAL | Status: DC | PRN
  Administered 2020-04-17: 5 mg via ORAL
  Filled 2020-04-17 (×2): qty 1

## 2020-04-17 NOTE — Progress Notes (Signed)
Foley has been removed per order.

## 2020-04-17 NOTE — Progress Notes (Signed)
Patient ID: Christy Mccann, female   DOB: 1931-01-23, 86 y.o.   MRN: [id number] [facility] PROGRESS NOTE  Christy Mccann TDD:[id number] DOB: July 31, 1931 DOA: 04/13/2020 PCP: [place]  HPI/Subjective: Patient seen this morning awakened from sleep.  She offers no complaints.  No chest pain or shortness of breath.  No abdominal pain.  Patient came in initially with bleeding in the urine and elevated creatinine.  Objective: Vitals:   04/17/20 0757 04/17/20 1212  BP: 131/70 (!) 150/85  Pulse: 87 85  Resp:    Temp: 98.3 F (36.8 C) 98.5 F (36.9 C)  SpO2: 96% 99%    Intake/Output Summary (Last 24 hours) at 04/17/2020 1305 Last data filed at 04/17/2020 0900 Gross per 24 hour  Intake 1840.36 ml  Output 1000 ml  Net 840.36 ml   Filed Weights   04/14/20 1032 04/14/20 1726 04/14/20 2309  Weight: 45 kg 58.1 kg 50.8 kg    ROS: Review of Systems  Respiratory: Negative for shortness of breath.   Cardiovascular: Negative for chest pain.  Gastrointestinal: Negative for abdominal pain, nausea and vomiting.   Exam: Physical Exam HENT:     Mouth/Throat:     Pharynx: No oropharyngeal exudate.  Eyes:     General: Lids are normal.     Conjunctiva/sclera: Conjunctivae normal.  Cardiovascular:     Rate and Rhythm: Normal rate and regular rhythm.     Heart sounds: Normal heart sounds, S1 normal and S2 normal.  Pulmonary:     Breath sounds: No decreased breath sounds, wheezing, rhonchi or rales.  Abdominal:     Palpations: Abdomen is soft.     Tenderness: There is no abdominal tenderness.  Musculoskeletal:     Right ankle: No swelling.     Left ankle: No swelling.  Skin:    General: Skin is warm.     Findings: No lesion.  Neurological:     Mental Status: She is alert.     Comments: Answers some yes or no questions       Data Reviewed: Basic Metabolic Panel: Recent Labs  Lab 04/13/20 0223 04/14/20 0437 04/15/20 0525 04/16/20 0525 04/17/20 0605  NA 139  144 141 144 140  K 4.2 4.6 5.3* 4.8 3.0*  CL 101 112* 111 112* 108  CO2 24 22 13* 16* 17*  GLUCOSE 135* 108* 117* 77 95  BUN 52* 45* 46* 56* 51*  CREATININE 4.47* 4.41* 5.62* 6.69* 6.19*  CALCIUM 10.5* 9.4 9.5 9.3 9.0   Liver Function Tests: Recent Labs  Lab 04/13/20 0223  AST 20  ALT 11  ALKPHOS 72  BILITOT 0.6  PROT 7.3  ALBUMIN 4.1   CBC: Recent Labs  Lab 04/12/20 1608 04/12/20 1608 04/13/20 0223 04/13/20 0223 04/13/20 0714 04/13/20 1915 04/14/20 0437 04/15/20 0525 04/17/20 0605  WBC 8.9  --  10.8*  --   --   --  7.7 11.5* 11.4*  NEUTROABS  --   --  8.0*  --   --   --   --   --   --   HGB 8.9*   < > 9.1*   < > 7.7* 9.1* 7.7* 8.3* 8.0*  HCT 28.4*   < > 27.5*   < > 24.7* 28.5* 23.8* 28.4* 25.1*  MCV 105.6*  --  102.6*  --   --   --  103.5* 113.6* 106.4*  PLT 305  --  314  --   --   --  253  268 215   < > = values in this interval not displayed.    CBG: Recent Labs  Lab 04/15/20 1028  GLUCAP 141*    Recent Results (from the past 240 hour(s))  SARS Coronavirus 2 by RT PCR (hospital order, performed in [facility] hospital lab) Nasopharyngeal Nasopharyngeal Swab     Status: None   Collection Time: 04/13/20  2:23 AM   Specimen: Nasopharyngeal Swab  Result Value Ref Range Status   SARS Coronavirus 2 NEGATIVE NEGATIVE Final    Comment: (NOTE) SARS-CoV-2 target nucleic acids are NOT DETECTED.  The SARS-CoV-2 RNA is generally detectable in upper and lower respiratory specimens during the acute phase of infection. The lowest concentration of SARS-CoV-2 viral copies this assay can detect is 250 copies / mL. A negative result does not preclude SARS-CoV-2 infection and should not be used as the sole basis for treatment or other patient management decisions.  A negative result may occur with improper specimen collection / handling, submission of specimen other than nasopharyngeal swab, presence of viral mutation(s) within the areas targeted by this assay, and  inadequate number of viral copies (<250 copies / mL). A negative result must be combined with clinical observations, patient history, and epidemiological information.  Fact Sheet for Patients:   StrictlyIdeas.no  Fact Sheet for Healthcare Providers: BankingDealers.co.za  This test is not yet approved or  cleared by the [place] FDA and has been authorized for detection and/or diagnosis of SARS-CoV-2 by FDA under an Emergency Use Authorization (EUA).  This EUA will remain in effect (meaning this test can be used) for the duration of the COVID-19 declaration under Section 564(b)(1) of the Act, 21 U.S.C. section 360bbb-3(b)(1), unless the authorization is terminated or revoked sooner.  Performed at [facility], [address], [place], [place] [address]   Urine culture     Status: Abnormal   Collection Time: 04/13/20  2:23 AM   Specimen: Urine, Random  Result Value Ref Range Status   Specimen Description   Final    URINE, RANDOM Performed at [facility], [address], [place], [place] [address]    Special Requests   Final    NONE Performed at [facility], [address] [place],  [address]    Culture (A)  Final    20,000 COLONIES/mL MULTIPLE SPECIES PRESENT, SUGGEST RECOLLECTION   Report Status 04/14/2020 FINAL  Final     Studies: IR NEPHROSTOMY PLACEMENT LEFT  Result Date: 04/15/2020 INDICATION: Bilateral hydronephrosis secondary to distal ureteral obstruction due to bladder tumor. Associated renal failure. EXAM: BILATERAL PERCUTANEOUS NEPHROSTOMY TUBE PLACEMENT COMPARISON:  CT of the abdomen and pelvis without contrast on 04/13/2020 MEDICATIONS: 2 g IV Ancef; The antibiotic was administered in an appropriate time frame prior to skin puncture. ANESTHESIA/SEDATION: The patient was not given conscious sedation due to relatively up tended state. CONTRAST:  25 mL Visipaque 320-administered  into the collecting system(s) FLUOROSCOPY TIME:  Fluoroscopy Time: 4 minutes and 30 seconds. 31.5 mGy. COMPLICATIONS: None immediate. PROCEDURE: Informed written consent was obtained from the patient after a thorough discussion of the procedural risks, benefits and alternatives. All questions were addressed. Maximal Sterile Barrier Technique was utilized including caps, mask, sterile gowns, sterile gloves, sterile drape, hand hygiene and skin antiseptic. A timeout was performed prior to the initiation of the procedure. Ultrasound was used to localize the kidneys. Under direct ultrasound guidance, both renal collecting systems were punctured with a 21 gauge needles and accessed with transitional dilators.  Over guidewires, access was dilated to 10 [place] and 10 [place] percutaneous nephrostomy tubes placed. Catheter position was confirmed by fluoroscopy after contrast injection. Both catheters were attached to gravity drainage bags and secured at the skin with Prolene retention sutures and StatLock devices. FINDINGS: Both kidneys demonstrate severe hydronephrosis. Catheters were formed at the level of the renal pelvis bilaterally and are draining urine. The left-sided catheter does extend partially into the proximal ureter. IMPRESSION: Placement of bilateral percutaneous nephrostomy tubes. Bilateral 10 French catheters were attached to gravity bag drainage. Electronically Signed   By: Aletta Edouard M.D.   On: 04/15/2020 16:28   IR NEPHROSTOMY PLACEMENT RIGHT  Result Date: 04/15/2020 INDICATION: Bilateral hydronephrosis secondary to distal ureteral obstruction due to bladder tumor. Associated renal failure. EXAM: BILATERAL PERCUTANEOUS NEPHROSTOMY TUBE PLACEMENT COMPARISON:  CT of the abdomen and pelvis without contrast on 04/13/2020 MEDICATIONS: 2 g IV Ancef; The antibiotic was administered in an appropriate time frame prior to skin puncture. ANESTHESIA/SEDATION: The patient was not given conscious sedation due  to relatively up tended state. CONTRAST:  25 mL Visipaque 320-administered into the collecting system(s) FLUOROSCOPY TIME:  Fluoroscopy Time: 4 minutes and 30 seconds. 31.5 mGy. COMPLICATIONS: None immediate. PROCEDURE: Informed written consent was obtained from the patient after a thorough discussion of the procedural risks, benefits and alternatives. All questions were addressed. Maximal Sterile Barrier Technique was utilized including caps, mask, sterile gowns, sterile gloves, sterile drape, hand hygiene and skin antiseptic. A timeout was performed prior to the initiation of the procedure. Ultrasound was used to localize the kidneys. Under direct ultrasound guidance, both renal collecting systems were punctured with a 21 gauge needles and accessed with transitional dilators. Over guidewires, access was dilated to 10 [place] and 10 [place] percutaneous nephrostomy tubes placed. Catheter position was confirmed by fluoroscopy after contrast injection. Both catheters were attached to gravity drainage bags and secured at the skin with Prolene retention sutures and StatLock devices. FINDINGS: Both kidneys demonstrate severe hydronephrosis. Catheters were formed at the level of the renal pelvis bilaterally and are draining urine. The left-sided catheter does extend partially into the proximal ureter. IMPRESSION: Placement of bilateral percutaneous nephrostomy tubes. Bilateral 10 French catheters were attached to gravity bag drainage. Electronically Signed   By: Aletta Edouard M.D.   On: 04/15/2020 16:28    Scheduled Meds: . amLODipine  2.5 mg Oral Daily  . Chlorhexidine Gluconate Cloth  6 each Topical Daily  . citalopram  20 mg Oral Daily  . famotidine  10 mg Oral QHS  . hydrALAZINE  50 mg Oral TID  . latanoprost  1 drop Both Eyes QHS  . lidocaine  1 patch Transdermal Q24H  . metoprolol succinate  25 mg Oral Daily  . pantoprazole  40 mg Oral BID  . pneumococcal 23 valent vaccine  0.5 mL Intramuscular  Tomorrow-1000  . polyethylene glycol  17 g Oral Daily  . sodium chloride flush  5 mL Intracatheter Q12H  . timolol  1 drop Both Eyes q AM  . traZODone  100 mg Oral QHS  . vitamin B-12  250 mcg Oral Daily   Continuous Infusions: . sodium chloride 40 mL/hr at 04/16/20 0736    Assessment/Plan:  1. Acute kidney injury secondary to obstruction with bilateral hydronephrosis.  Nephrostomy tubes placed on 04/15/2020.  Creatinine peaked yesterday at 6.69 and is a little bit better today at 6.19.  Continue to trend for another couple days. 2. Bladder mass.  Pathology still pending. 3. Hyperkalemia improved today  discontinue Lokelma since potassium actually low. 4. Acute blood loss anemia with hematuria.  Xarelto now contraindicated.  Monitor hemoglobin. 5. Essential hypertension on Norvasc, hydralazine and Toprol.  I decreased the dose of Norvasc and hydralazine yesterday. 6. Paroxysmal atrial fibrillation on low-dose Toprol. 7. Acquired thrombophilia secondary to paroxysmal atrial fibrillation 8. Constipation has resolved patient had a bowel movement.  Continue MiraLAX 9. Cognitive issues.  Mental status was better yesterday than this morning but patient did answer questions.  I will make Zyprexa as needed at this point. 10. Large hiatal hernia.  Continue PPI        Code Status:     Code Status Orders  (From admission, onward)         Start     Ordered   04/13/20 0617  Full code  Continuous        04/13/20 0623        Code Status History    This patient has a current code status but no historical code status.   Advance Care Planning Activity    Advance Directive Documentation     Most Recent Value  Type of Advance Directive Out of facility DNR (pink MOST or yellow form)  [see pink sheet]  Pre-existing out of facility DNR order (yellow form or pink MOST form) Pink MOST/Yellow Form most recent copy in chart - Physician notified to receive inpatient order  "MOST" Form in Place?  --     Family Communication: Spoke with son on the phone Disposition Plan: Status is: Inpatient  Dispo: The patient is from: Home              Anticipated d/c is to: Potential rehab              Anticipated d/c date is: Potential 04/19/2020 if creatinine continues to improve              Patient currently needs to be watched for creatinine improvement.  Would also like to see bladder pathology and set up a plan for follow-up.  Consultants:  Urology  Procedures:  Bilateral nephrostomy tubes  Cystoscopy  Time spent: 26 minutes  [place]

## 2020-04-18 DIAGNOSIS — C679 Malignant neoplasm of bladder, unspecified: Secondary | ICD-10-CM

## 2020-04-18 LAB — SURGICAL PATHOLOGY

## 2020-04-18 LAB — PREPARE RBC (CROSSMATCH)

## 2020-04-18 LAB — BASIC METABOLIC PANEL
Anion gap: 12 (ref 5–15)
BUN: 51 mg/dL — ABNORMAL HIGH (ref 8–23)
CO2: 21 mmol/L — ABNORMAL LOW (ref 22–32)
Calcium: 8.4 mg/dL — ABNORMAL LOW (ref 8.9–10.3)
Chloride: 110 mmol/L (ref 98–111)
Creatinine, Ser: 5.65 mg/dL — ABNORMAL HIGH (ref 0.44–1.00)
GFR calc Af Amer: 7 mL/min — ABNORMAL LOW (ref >60)
GFR calc non Af Amer: 6 mL/min — ABNORMAL LOW (ref >60)
Glucose, Bld: 96 mg/dL (ref 70–99)
Potassium: 3.2 mmol/L — ABNORMAL LOW (ref 3.5–5.1)
Sodium: 143 mmol/L (ref 135–145)

## 2020-04-18 LAB — HEMOGLOBIN: Hemoglobin: 7.6 g/dL — ABNORMAL LOW (ref 12.0–15.0)

## 2020-04-18 MED ORDER — SODIUM CHLORIDE 0.9% IV SOLUTION: Freq: Once | INTRAVENOUS | Status: AC

## 2020-04-18 MED ORDER — ACETAMINOPHEN 325 MG PO TABS
650.0000 mg | ORAL_TABLET | Freq: Once | ORAL | Status: AC
  Administered 2020-04-18: 650 mg via ORAL
  Filled 2020-04-18: qty 2

## 2020-04-18 MED ORDER — POTASSIUM CHLORIDE CRYS ER 20 MEQ PO TBCR
40.0000 meq | EXTENDED_RELEASE_TABLET | Freq: Once | ORAL | Status: AC
  Administered 2020-04-18: 40 meq via ORAL
  Filled 2020-04-18: qty 2

## 2020-04-18 MED ORDER — FUROSEMIDE 10 MG/ML IJ SOLN
20.0000 mg | Freq: Once | INTRAMUSCULAR | Status: AC
  Administered 2020-04-18: 20 mg via INTRAVENOUS
  Filled 2020-04-18: qty 2

## 2020-04-18 NOTE — NC FL2 (Signed)
[place] LEVEL OF CARE SCREENING TOOL     IDENTIFICATION  Patient Name: Christy Mccann Birthdate: 04-04-1931 Sex: female Admission Date (Current Location): 04/13/2020  [place] and [place] Number:  Engineering geologist and Address:  [facility], [address] [place], [place] [address]      Provider Number: [id number]  Attending Physician Name and Address:  Loletha Grayer, MD  Relative Name and Phone Number:  Cloria Spring [phone]    Current Level of Care: SNF Recommended Level of Care: [place] Prior Approval Number:    Date Approved/Denied:   PASRR Number: [id number]  Discharge Plan: SNF    Current Diagnoses: Patient Active Problem List   Diagnosis Date Noted  . Malignant neoplasm of urinary bladder ([place])   . Acquired thrombophilia ([place])   . Hyperkalemia   . Acute kidney injury ([place]) 04/14/2020  . Essential hypertension   . AF (paroxysmal atrial fibrillation) ([place])   . Bladder mass   . Acute kidney injury (AKI) with acute tubular necrosis (ATN) ([facility]) 04/13/2020  . AKI (acute kidney injury) ([place])   . Hydronephrosis   . Acute on chronic blood loss anemia   . Hematuria   . Constipation   . Hypercalcemia   . Hiatal hernia     Orientation RESPIRATION BLADDER Height & Weight     Self, Situation, Place  Normal Incontinent Weight: 50.1 kg Height:  5' (152.4 cm)  BEHAVIORAL SYMPTOMS/MOOD NEUROLOGICAL BOWEL NUTRITION STATUS      Incontinent Diet  AMBULATORY STATUS COMMUNICATION OF NEEDS Skin   Limited Assist   Normal                       Personal Care Assistance Level of Assistance  Bathing, Dressing Bathing Assistance: Limited assistance   Dressing Assistance: Limited assistance     Functional Limitations Info             SPECIAL CARE FACTORS FREQUENCY  PT (By licensed PT), OT (By licensed OT)     PT Frequency: 5x a week OT Frequency: 5x a week            Contractures  Contractures Info: Not present    Additional Factors Info  Code Status, Allergies Code Status Info: Full Allergies Info: Gabapentin           Current Medications (04/18/2020):  This is the current hospital active medication list Current Facility-Administered Medications  Medication Dose Route Frequency Provider Last Rate Last Admin  . 0.9 %  sodium chloride infusion (Manually program via Guardrails IV Fluids)   Intravenous Once Loletha Grayer, MD      . acetaminophen (TYLENOL) tablet 650 mg  650 mg Oral Q6H PRN Mansy, Jan A, MD   650 mg at 04/14/20 2004   Or  . acetaminophen (TYLENOL) suppository 650 mg  650 mg Rectal Q6H PRN Mansy, Jan A, MD      . amLODipine (NORVASC) tablet 2.5 mg  2.5 mg Oral Daily Loletha Grayer, MD   2.5 mg at 04/18/20 0831  . bisacodyl (DULCOLAX) suppository 10 mg  10 mg Rectal Daily PRN Loletha Grayer, MD      . Chlorhexidine Gluconate Cloth 2 % PADS 6 each  6 each Topical Daily Loletha Grayer, MD   6 each at 04/18/20 1319  . citalopram (CELEXA) tablet 20 mg  20 mg Oral Daily Loletha Grayer, MD   20 mg at 04/18/20 0831  . famotidine (PEPCID)  tablet 10 mg  10 mg Oral QHS Loletha Grayer, MD   10 mg at 04/17/20 2137  . hydrALAZINE (APRESOLINE) tablet 50 mg  50 mg Oral TID Loletha Grayer, MD   50 mg at 04/18/20 0830  . labetalol (NORMODYNE) injection 20 mg  20 mg Intravenous Q3H PRN Mansy, Jan A, MD      . latanoprost (XALATAN) 0.005 % ophthalmic solution 1 drop  1 drop Both Eyes QHS Loletha Grayer, MD   1 drop at 04/17/20 2137  . lidocaine (LIDODERM) 5 % 1 patch  1 patch Transdermal Q24H Loletha Grayer, MD   1 patch at 04/17/20 1642  . magnesium hydroxide (MILK OF MAGNESIA) suspension 30 mL  30 mL Oral Daily PRN Mansy, Jan A, MD      . metoprolol succinate (TOPROL-XL) 24 hr tablet 25 mg  25 mg Oral Daily Loletha Grayer, MD   25 mg at 04/18/20 0830  . metoprolol tartrate (LOPRESSOR) injection 2.5 mg  2.5 mg Intravenous Q6H PRN Mansy, Jan A, MD    2.5 mg at 04/13/20 2349  . ondansetron (ZOFRAN) tablet 4 mg  4 mg Oral Q6H PRN Mansy, Jan A, MD       Or  . ondansetron [facility]) injection 4 mg  4 mg Intravenous Q6H PRN Mansy, Jan A, MD   4 mg at 04/15/20 1026  . pantoprazole (PROTONIX) EC tablet 40 mg  40 mg Oral BID Loletha Grayer, MD   40 mg at 04/18/20 0829  . pneumococcal 23 valent vaccine (PNEUMOVAX-23) injection 0.5 mL  0.5 mL Intramuscular Tomorrow-1000 Wieting, Richard, MD      . polyethylene glycol (MIRALAX / GLYCOLAX) packet 17 g  17 g Oral Daily Loletha Grayer, MD   17 g at 04/18/20 4585  . polyvinyl alcohol (LIQUIFILM TEARS) 1.4 % ophthalmic solution 1-2 drop  1-2 drop Both Eyes BID PRN Wieting, Richard, MD      . sodium chloride flush (NS) 0.9 % injection 5 mL  5 mL Intracatheter Q12H Aletta Edouard, MD   5 mL at 04/18/20 [phone]  . timolol (TIMOPTIC) 0.5 % ophthalmic solution 1 drop  1 drop Both Eyes q AM Wieting, Richard, MD   1 drop at 04/18/20 1320  . traZODone (DESYREL) tablet 100 mg  100 mg Oral QHS Loletha Grayer, MD   100 mg at 04/17/20 2137  . vitamin B-12 (CYANOCOBALAMIN) tablet 250 mcg  250 mcg Oral Daily Loletha Grayer, MD   250 mcg at 04/17/20 4462     Discharge Medications: Please see discharge summary for a list of discharge medications.  Relevant Imaging Results:  Relevant Lab Results:   Additional Information [id number]  Victorino Dike, RN

## 2020-04-18 NOTE — Progress Notes (Signed)
Patient ID: Christy Mccann, female   DOB: 10-Jun-1931, 86 y.o.   MRN: [id number] [facility] PROGRESS NOTE  SHEREA LIPTAK FGH:[id number] DOB: 10/28/1930 DOA: 04/13/2020 PCP: [facility]  HPI/Subjective: Patient admitted with elevated creatinine and bleeding in the urine.  Patient seen this morning and again this afternoon.  I explained that the bladder biopsy came back as a cancerous process.  Patient did not have any questions for me.  Spoke with son and patient about blood transfusion.  When I try to move the patient's right arm she did not want me to do that.  Objective: Vitals:   04/18/20 1045 04/18/20 1111  BP: (!) 144/78 138/62  Pulse: 92 75  Resp:  16  Temp:  97.9 F (36.6 C)  SpO2:  99%    Intake/Output Summary (Last 24 hours) at 04/18/2020 1357 Last data filed at 04/18/2020 0800 Gross per 24 hour  Intake 805.25 ml  Output 1925 ml  Net -1119.75 ml   Filed Weights   04/14/20 1726 04/14/20 2309 04/18/20 0407  Weight: 58.1 kg 50.8 kg 50.1 kg    ROS: Review of Systems  Respiratory: Negative for shortness of breath.   Cardiovascular: Negative for chest pain.  Gastrointestinal: Negative for abdominal pain, nausea and vomiting.  Musculoskeletal: Positive for joint pain.   Exam: Physical Exam HENT:     Nose: No mucosal edema.     Mouth/Throat:     Pharynx: No oropharyngeal exudate.  Eyes:     General: Lids are normal.     Conjunctiva/sclera: Conjunctivae normal.     Pupils: Pupils are equal, round, and reactive to light.  Cardiovascular:     Rate and Rhythm: Normal rate and regular rhythm.     Heart sounds: Normal heart sounds, S1 normal and S2 normal.  Pulmonary:     Breath sounds: No decreased breath sounds, wheezing, rhonchi or rales.  Abdominal:     Palpations: Abdomen is soft.     Tenderness: There is no abdominal tenderness.  Musculoskeletal:     Right ankle: Normal.     Left ankle: Normal.  Skin:    General: Skin is warm.     Findings:  No rash.  Neurological:     Mental Status: She is alert.     Comments: Answers some yes/no questions.       Data Reviewed: Basic Metabolic Panel: Recent Labs  Lab 04/14/20 0437 04/15/20 0525 04/16/20 0525 04/17/20 0605 04/18/20 0939  NA 144 141 144 140 143  K 4.6 5.3* 4.8 3.0* 3.2*  CL 112* 111 112* 108 110  CO2 22 13* 16* 17* 21*  GLUCOSE 108* 117* 77 95 96  BUN 45* 46* 56* 51* 51*  CREATININE 4.41* 5.62* 6.69* 6.19* 5.65*  CALCIUM 9.4 9.5 9.3 9.0 8.4*   Liver Function Tests: Recent Labs  Lab 04/13/20 0223  AST 20  ALT 11  ALKPHOS 72  BILITOT 0.6  PROT 7.3  ALBUMIN 4.1   CBC: Recent Labs  Lab 04/12/20 1608 04/12/20 1608 04/13/20 0223 04/13/20 0223 04/13/20 0714 04/13/20 0714 04/13/20 1915 04/14/20 0437 04/15/20 0525 04/17/20 0605 04/18/20 0939  WBC 8.9  --  10.8*  --   --   --   --  7.7 11.5* 11.4*  --   NEUTROABS  --   --  8.0*  --   --   --   --   --   --   --   --   HGB 8.9*   < >  9.1*   < > 7.7*   < > 9.1* 7.7* 8.3* 8.0* 7.6*  HCT 28.4*   < > 27.5*   < > 24.7*  --  28.5* 23.8* 28.4* 25.1*  --   MCV 105.6*  --  102.6*  --   --   --   --  103.5* 113.6* 106.4*  --   PLT 305  --  314  --   --   --   --  253 268 215  --    < > = values in this interval not displayed.    CBG: Recent Labs  Lab 04/15/20 1028  GLUCAP 141*    Recent Results (from the past 240 hour(s))  SARS Coronavirus 2 by RT PCR (hospital order, performed in [facility] hospital lab) Nasopharyngeal Nasopharyngeal Swab     Status: None   Collection Time: 04/13/20  2:23 AM   Specimen: Nasopharyngeal Swab  Result Value Ref Range Status   SARS Coronavirus 2 NEGATIVE NEGATIVE Final    Comment: (NOTE) SARS-CoV-2 target nucleic acids are NOT DETECTED.  The SARS-CoV-2 RNA is generally detectable in upper and lower respiratory specimens during the acute phase of infection. The lowest concentration of SARS-CoV-2 viral copies this assay can detect is 250 copies / mL. A negative result  does not preclude SARS-CoV-2 infection and should not be used as the sole basis for treatment or other patient management decisions.  A negative result may occur with improper specimen collection / handling, submission of specimen other than nasopharyngeal swab, presence of viral mutation(s) within the areas targeted by this assay, and inadequate number of viral copies (<250 copies / mL). A negative result must be combined with clinical observations, patient history, and epidemiological information.  Fact Sheet for Patients:   StrictlyIdeas.no  Fact Sheet for Healthcare Providers: BankingDealers.co.za  This test is not yet approved or  cleared by the [place] FDA and has been authorized for detection and/or diagnosis of SARS-CoV-2 by FDA under an Emergency Use Authorization (EUA).  This EUA will remain in effect (meaning this test can be used) for the duration of the COVID-19 declaration under Section 564(b)(1) of the Act, 21 U.S.C. section 360bbb-3(b)(1), unless the authorization is terminated or revoked sooner.  Performed at [facility], [address] [place], [place] [address]   Urine culture     Status: Abnormal   Collection Time: 04/13/20  2:23 AM   Specimen: Urine, Random  Result Value Ref Range Status   Specimen Description   Final    URINE, RANDOM Performed at [facility], [address] [place], [place] [address]    Special Requests   Final    NONE Performed at [facility], [address] [place], [place] [address]    Culture (A)  Final    20,000 COLONIES/mL MULTIPLE SPECIES PRESENT, SUGGEST RECOLLECTION   Report Status 04/14/2020 FINAL  Final      Scheduled Meds: . sodium chloride   Intravenous Once  . amLODipine  2.5 mg Oral Daily  . Chlorhexidine Gluconate Cloth  6 each Topical Daily  . citalopram  20 mg Oral Daily  . famotidine  10 mg Oral QHS  . hydrALAZINE  50 mg  Oral TID  . latanoprost  1 drop Both Eyes QHS  . lidocaine  1 patch Transdermal Q24H  . metoprolol succinate  25 mg Oral Daily  . pantoprazole  40 mg Oral BID  . pneumococcal 23 valent vaccine  0.5 mL  Intramuscular Tomorrow-1000  . polyethylene glycol  17 g Oral Daily  . sodium chloride flush  5 mL Intracatheter Q12H  . timolol  1 drop Both Eyes q AM  . traZODone  100 mg Oral QHS  . vitamin B-12  250 mcg Oral Daily   Continuous Infusions:  Assessment/Plan:  1. Acute kidney injury secondary to obstruction with bilateral hydronephrosis.  Nephrostomy tubes placed by interventional radiology on 04/15/2020.  Creatinine peaked at 6.69.  Today the creatinine is down to 5.65.  Check creatinine again tomorrow morning. 2. 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  I do not think the patient understood what I was trying to tell her about a cancerous process.  Spoke with son on the phone.  He is leaning towards no treatment because of her age and condition.  Patient is full code.  Patient's son declined palliative care consultation at this point.  Will have to follow-up with [facility] for goals of care conversation in the future. 3. Hyperkalemia resolved.  Now with hypokalemia. 4. Acute blood loss anemia with hematuria.  Xarelto now contraindicated.  Transfuse 1 unit of packed red blood cells over 4 hours.  Case discussed with patient and son. 5. Paroxysmal atrial fibrillation on low-dose Toprol 6. Essential hypertension.  Patient on decreased dose of Norvasc, hydralazine and Toprol. 7. Acquired thrombophilia secondary to paroxysmal atrial fibrillation 8. Constipation.  MiraLAX continued. 9. Cognitive issues.  Patient's mental status has waxed and waned here in the hospital.  Likely has underlying dementia but will leave this diagnosis for the [facility] as outpatient.  Continue trazodone at night for sleep.  Get rid of Zyprexa since  patient did have some lip smacking this morning when I was talking with her. 10. Large hiatal hernia on PPI   Code Status:     Code Status Orders  (From admission, onward)         Start     Ordered   04/13/20 0617  Full code  Continuous        04/13/20 0623        Code Status History    This patient has a current code status but no historical code status.   Advance Care Planning Activity    Advance Directive Documentation     Most Recent Value  Type of Advance Directive Out of facility DNR (pink MOST or yellow form)  [see pink sheet]  Pre-existing out of facility DNR order (yellow form or pink MOST form) Pink MOST/Yellow Form most recent copy in chart - Physician notified to receive inpatient order  "MOST" Form in Place? --     Family Communication: Spoke with son on the phone Disposition Plan: Status is: Inpatient  Dispo: The patient is from: Home              Anticipated d/c is to: Rehab              Anticipated d/c date is: Transitional care team to speak with [facility] about looking into rehab beds.              Patient currently going to be transfused a unit of blood today and want to check creatinine tomorrow to see if it continues to move in the right direction.  Consultants:  Urology  Time spent: 29 minutes  [place]

## 2020-04-18 NOTE — TOC Initial Note (Signed)
Transition of Care [facility]) - Initial/Assessment Note    Patient Details  Name: Christy Mccann MRN: [id number] Date of Birth: [birth year]  [facility] CM/SW Contact:    Victorino Dike, RN Phone Number: 04/18/2020, 3:29 PM  Clinical Narrative:                 Spoke with Care Manager at [facility] [phone], asked to send [place] and bed request to [facility] in [place], [place] and [employer]. Information sent.  Expected Discharge Plan: Skilled Nursing Facility Barriers to Discharge: Continued Medical Work up   Patient Goals and CMS Choice        Expected Discharge Plan and Services Expected Discharge Plan: [place]   Discharge Planning Services: CM Consult Post Acute Care Choice: [place] Living arrangements for the past 2 months: Single Family Home                                      Prior Living Arrangements/Services Living arrangements for the past 2 months: Single Family Home Lives with:: Adult Children Patient language and need for interpreter reviewed:: Yes        Need for Family Participation in Patient Care: Yes (Comment) Care giver support system in place?: Yes (comment)   Criminal Activity/Legal Involvement Pertinent to Current Situation/Hospitalization: No - Comment as needed  Activities of Daily Living Home Assistive Devices/Equipment: Cane (specify quad or straight) (only when she goes out) ADL Screening (condition at time of admission) Patient's cognitive ability adequate to safely complete daily activities?: Yes Is the patient deaf or have difficulty hearing?: No Does the patient have difficulty seeing, even when wearing glasses/contacts?: No Does the patient have difficulty concentrating, remembering, or making decisions?: No Patient able to express need for assistance with ADLs?: Yes Does the patient have difficulty dressing or bathing?: No Independently performs ADLs?: Yes (appropriate  for developmental age) Does the patient have difficulty walking or climbing stairs?: No Weakness of Legs: None Weakness of Arms/Hands: None  Permission Sought/Granted                  Emotional Assessment       Orientation: : Oriented to Self, Oriented to Place, Oriented to Situation Alcohol / Substance Use: Not Applicable Psych Involvement: No (comment)  Admission diagnosis:  Hydroureteronephrosis [N13.30] Acute kidney injury ([place]) [N17.9] Acute renal failure, unspecified acute renal failure type ([place]) [N17.9] Acute kidney injury (AKI) with acute tubular necrosis (ATN) ([place]) [N17.0] Patient Active Problem List   Diagnosis Date Noted  . Malignant neoplasm of urinary bladder ([place])   . Acquired thrombophilia ([place])   . Hyperkalemia   . Acute kidney injury ([place]) 04/14/2020  . Essential hypertension   . AF (paroxysmal atrial fibrillation) ([place])   . Bladder mass   . Acute kidney injury (AKI) with acute tubular necrosis (ATN) (H[facility]) 04/13/2020  . AKI (acute kidney injury) ([place])   . Hydronephrosis   . Acute on chronic blood loss anemia   . Hematuria   . Constipation   . Hypercalcemia   . Hiatal hernia    PCP:  [facility]:   [place], [place] - [address] [place] [address] Phone: [phone] Fax: [phone]     Social Determinants of Health (SDOH) Interventions    Readmission Risk Interventions No flowsheet data  found.  

## 2020-04-18 NOTE — Progress Notes (Signed)
PT Cancellation Note  Patient Details Name: Christy Mccann MRN: [id number] DOB: 10-16-1930   Cancelled Treatment:    Reason Eval/Treat Not Completed: Other (comment) Upon arrival, pt sleeping soundly in bed. Breakfast tray noted to be next to bed untouched. Despite continued verbal cues, pt unable to awaken and remain alert to participate in PT session. Will attempt another date/time when appropriate.   [place] 04/18/2020, 9:16 AM

## 2020-04-18 NOTE — Care Management Important Message (Signed)
Important Message  Patient Details  Name: Christy Mccann MRN: [id number] Date of Birth: [birth year]   Medicare Important Message Given:  Yes     Dannette Barbara 04/18/2020, 12:58 PM

## 2020-04-19 ENCOUNTER — Inpatient Hospital Stay: Payer: Medicare (Managed Care)

## 2020-04-19 LAB — BASIC METABOLIC PANEL
Anion gap: 14 (ref 5–15)
BUN: 55 mg/dL — ABNORMAL HIGH (ref 8–23)
CO2: 21 mmol/L — ABNORMAL LOW (ref 22–32)
Calcium: 9 mg/dL (ref 8.9–10.3)
Chloride: 106 mmol/L (ref 98–111)
Creatinine, Ser: 5.54 mg/dL — ABNORMAL HIGH (ref 0.44–1.00)
GFR calc Af Amer: 7 mL/min — ABNORMAL LOW (ref >60)
GFR calc non Af Amer: 6 mL/min — ABNORMAL LOW (ref >60)
Glucose, Bld: 97 mg/dL (ref 70–99)
Potassium: 3.6 mmol/L (ref 3.5–5.1)
Sodium: 141 mmol/L (ref 135–145)

## 2020-04-19 LAB — TYPE AND SCREEN
ABO/RH(D): O POS
Antibody Screen: NEGATIVE
Unit division: 0

## 2020-04-19 LAB — HEMOGLOBIN: Hemoglobin: 10.2 g/dL — ABNORMAL LOW (ref 12.0–15.0)

## 2020-04-19 LAB — MAGNESIUM: Magnesium: 1.9 mg/dL (ref 1.7–2.4)

## 2020-04-19 LAB — BPAM RBC
Blood Product Expiration Date: 202109222359
ISSUE DATE / TIME: 202108231534
Unit Type and Rh: 5100

## 2020-04-19 LAB — PTH-RELATED PEPTIDE: PTH-related peptide: <2 pmol/L

## 2020-04-19 MED ORDER — SODIUM CHLORIDE 0.9 % IV SOLN: INTRAVENOUS | Status: DC

## 2020-04-19 NOTE — Plan of Care (Signed)

## 2020-04-19 NOTE — Progress Notes (Signed)
Urology Consult Follow Up  Subjective: No complaints today. Alert, conversant   Objective: Vital signs in last 24 hours: Temp:  [97.6 F (36.4 C)-99.1 F (37.3 C)] 99.1 F (37.3 C) (08/24 2013) Pulse Rate:  [56-91] 79 (08/24 2013) Resp:  [18-20] 20 (08/24 1656) BP: (130-156)/(53-91) 156/75 (08/24 2013) SpO2:  [96 %-99 %] 98 % (08/24 2013) Weight:  [49.9 kg] 49.9 kg (08/24 0426)  Intake/Output from previous day: 08/23 0701 - 08/24 0700 In: 330 [Blood:330] Out: 2320 [Urine:2320] Intake/Output this shift: No intake/output data recorded.   Physical Exam NAD, nephrostomy urine blood-tinged left, clear right  Lab Results:  Recent Labs    04/17/20 0605 04/17/20 0605 04/18/20 0939 04/19/20 0541  WBC 11.4*  --   --   --   HGB 8.0*   < > 7.6* 10.2*  HCT 25.1*  --   --   --   PLT 215  --   --   --    < > = values in this interval not displayed.   BMET Recent Labs    04/18/20 0939 04/19/20 0541  NA 143 141  K 3.2* 3.6  CL 110 106  CO2 21* 21*  GLUCOSE 96 97  BUN 51* 55*  CREATININE 5.65* 5.54*  CALCIUM 8.4* 9.0    Studies/Results: MR BRAIN WO CONTRAST  Addendum Date: 04/19/2020   ADDENDUM REPORT: 04/19/2020 14:20 ADDENDUM: History: Atrial fibrillation. Confusion. Metastatic disease evaluation. Brain: The study suffers from some motion degradation. Diffusion imaging does not show any acute or subacute infarction or other cause of restricted diffusion. No focal abnormality affects the brainstem or cerebellum. Cerebral hemispheres show moderate to marked chronic small-vessel ischemic changes of the white matter. No cortical or large vessel territory infarction. No evidence of mass lesion, hemorrhage, hydrocephalus or extra-axial collection. Vascular: Major vessels at the base of the brain show flow. Skull and upper cervical spine: Normal. Sinuses/Orbits: Clear/normal. Other: None significant. IMPRESSION: IMPRESSION No acute or subacute finding. Moderate to marked chronic  small-vessel ischemic changes affecting the cerebral hemispheric white matter. No sign of mass lesion. Electronically Signed   By: Nelson Chimes M.D.   On: 04/19/2020 14:20   Result Date: 04/19/2020 EXAM: MRI HEAD WITHOUT CONTRAST TECHNIQUE: Multiplanar, multiecho pulse sequences of the brain and surrounding structures were obtained without intravenous contrast. COMPARISON:  None. FINDINGS: Brain: Vascular: Skull and upper cervical spine: Sinuses/Orbits: Other: Electronically Signed: By: Nelson Chimes M.D. On: 04/19/2020 14:04     Assessment:  1.  High-grade muscle invasive urothelial carcinoma bladder  I discussed the pathology report in detail with Ms. Mcmullen and she appeared to comprehend her cancer diagnosis  We discussed the standard treatment radical cystectomy however based on her age and comorbidities do not feel she is a good surgical candidate  Other options of chemotherapy and radiation therapy were discussed.  Not a candidate for chemotherapy unless her creatinine significantly improves and would refer to medical oncology regarding chemotherapy  2.  Bilateral hydronephrosis secondary to malignant obstruction distal ureters  Status post bilateral nephrostomy tube placement  Creatinine slowly improving  s/p Procedure(s): CYSTOSCOPY TRANSURETHRAL RESECTION OF BLADDER TUMOR (TURBT)  Recommendation:  Outpatient medical oncology evaluation regarding further treatment  If treatment is elected consider internalization of nephrostomy tubes in approximately 4 weeks    LOS: 6 days    Ronda Fairly [facility] 04/19/2020

## 2020-04-19 NOTE — TOC Progression Note (Signed)
Transition of Care [facility]) - Progression Note    Patient Details  Name: Christy Mccann MRN: [id number] Date of Birth: 1931/01/24  Transition of Care [facility]) CM/SW [place], RN Phone Number: 04/19/2020, 2:49 PM  Clinical Narrative:     Damaris Schooner with [facility] on the phone today and presented bed offers.  He chose [facility].    Expected Discharge Plan: [place] Barriers to Discharge: Continued Medical Work up  Expected Discharge Plan and Services Expected Discharge Plan: [place]   Discharge Planning Services: CM Consult Post Acute Care Choice: [place] Living arrangements for the past 2 months: Single Family Home                                       Social Determinants of Health (SDOH) Interventions    Readmission Risk Interventions No flowsheet data found.

## 2020-04-19 NOTE — Progress Notes (Signed)
Physical Therapy Treatment Patient Details Name: Christy Mccann MRN: [id number] DOB: 08/31/1930 Today's Date: 04/19/2020    History of Present Illness Christy Mccann  is a 86 y.o. slim Caucasian female with a known history of atrial fibrillation, depression, hypertension and hypothyroidism, who presented to the emergency room with acute onset of hematuria which has been intermittent over the last week.  The patient has been on Eliquis for several months for her atrial fibrillation.  She has been having occasional mild fatigue and tiredness.  She denies any abdominal or flank pain.  No fever or chills.  She has been having headache without dizziness or blurred vision.  No chest pain or dyspnea or cough or wheezing or hemoptysis.  No other bleeding diathesis    PT Comments    Patient received in bed, agreeable to PT. Oriented, but talking about a murder here last night and someone looking in a window. Patient able to follow direction and participate. She requires min assist for supine to sit. Transfers with min assist and able to take a few steps from bed to recliner. She then performed LE exercises in the recliner. She will continue to benefit from skilled PT while here to improve strength and functional independence.     Follow Up Recommendations  SNF;Supervision for mobility/OOB     Equipment Recommendations  Rolling walker with 5" wheels    Recommendations for Other Services       Precautions / Restrictions Precautions Precautions: Fall Precaution Comments: B nephrostomy tubes Restrictions Weight Bearing Restrictions: No    Mobility  Bed Mobility Overal bed mobility: Needs Assistance Bed Mobility: Supine to Sit     Supine to sit: Min assist        Transfers Overall transfer level: Needs assistance Equipment used: 1 person hand held assist Transfers: Sit to/from Stand Sit to Stand: Min assist         General transfer comment: min A with verbal cues for UE  placements  Ambulation/Gait Ambulation/Gait assistance: Modified independent (Device/Increase time) Gait Distance (Feet): 4 Feet Assistive device: 1 person hand held assist Gait Pattern/deviations: Step-to pattern;Decreased stride length;Shuffle Gait velocity: decreased   General Gait Details: patient generally weak and unsteady with mobility   Stairs             Wheelchair Mobility    Modified Rankin (Stroke Patients Only)       Balance Overall balance assessment: Needs assistance Sitting-balance support: Feet supported Sitting balance-Leahy Scale: Good     Standing balance support: Single extremity supported;During functional activity Standing balance-Leahy Scale: Fair Standing balance comment: reliant on external and UE assist for balance and safety at this time                            Cognition Arousal/Alertness: Awake/alert Behavior During Therapy: [facility] for tasks assessed/performed Overall Cognitive Status: No family/caregiver present to determine baseline cognitive functioning                                 General Comments: Patient telling me first about a murder last night and then about a man with a towel on his head who was looking in the window.      Exercises Other Exercises Other Exercises: B LE strengthening exercises: AP, hip abd/add, LAQ, Heel slides x 10 reps each. Fatigued with exercises    General Comments  Pertinent Vitals/Pain Pain Assessment: No/denies pain    Home Living                      Prior Function            PT Goals (current goals can now be found in the care plan section) Acute Rehab PT Goals Patient Stated Goal: patient is open to STR PT Goal Formulation: With patient Time For Goal Achievement: 04/30/20 Potential to Achieve Goals: Good Progress towards PT goals: Progressing toward goals    Frequency    Min 2X/week      PT Plan Current plan remains appropriate     Co-evaluation              AM-PAC PT "6 Clicks" Mobility   Outcome Measure  Help needed turning from your back to your side while in a flat bed without using bedrails?: A Little Help needed moving from lying on your back to sitting on the side of a flat bed without using bedrails?: A Little Help needed moving to and from a bed to a chair (including a wheelchair)?: A Little Help needed standing up from a chair using your arms (e.g., wheelchair or bedside chair)?: A Little Help needed to walk in hospital room?: A Lot Help needed climbing 3-5 steps with a railing? : A Lot 6 Click Score: 16    End of Session Equipment Utilized During Treatment: Gait belt Activity Tolerance: Patient limited by fatigue Patient left: in chair;with call bell/phone within reach;with chair alarm set Nurse Communication: Mobility status PT Visit Diagnosis: Unsteadiness on feet (R26.81);Muscle weakness (generalized) (M62.81);Difficulty in walking, not elsewhere classified (R26.2)     Time: 1030-1100 PT Time Calculation (min) (ACUTE ONLY): 30 min  Charges:  $Therapeutic Exercise: 8-22 mins $Therapeutic Activity: 8-22 mins                     Wanisha Shiroma, PT, GCS 04/19/20,12:33 PM

## 2020-04-19 NOTE — Progress Notes (Signed)
Patient ID: Christy Mccann, female   DOB: 08-25-1931, 86 y.o.   MRN: [id number] [facility] PROGRESS NOTE  Christy Mccann YPP:[id number] DOB: [birth year] DOA: 04/13/2020 PCP: [place]  HPI/Subjective: Patient more alert today seen this morning.  She was able to understand about her biopsy being cancerous.  She feels okay.  Came in with bleeding in the urine.  Son concerned about her waxing and waning mental status.  Objective: Vitals:   04/19/20 1140 04/19/20 1656  BP: 136/71 (!) 136/53  Pulse: 89 75  Resp: 18 20  Temp: 97.8 F (36.6 C) 97.6 F (36.4 C)  SpO2: 99% 96%    Intake/Output Summary (Last 24 hours) at 04/19/2020 1726 Last data filed at 04/19/2020 1656 Gross per 24 hour  Intake 330 ml  Output 1920 ml  Net -1590 ml   Filed Weights   04/14/20 2309 04/18/20 0407 04/19/20 0426  Weight: 50.8 kg 50.1 kg 49.9 kg    ROS: Review of Systems  Respiratory: Negative for shortness of breath.   Cardiovascular: Negative for chest pain.  Gastrointestinal: Negative for abdominal pain, nausea and vomiting.   Exam: Physical Exam HENT:     Mouth/Throat:     Pharynx: No oropharyngeal exudate.  Eyes:     General: Lids are normal.     Extraocular Movements: Extraocular movements intact.     Pupils: Pupils are equal, round, and reactive to light.  Cardiovascular:     Rate and Rhythm: Normal rate and regular rhythm.     Heart sounds: Normal heart sounds, S1 normal and S2 normal.  Pulmonary:     Breath sounds: No decreased breath sounds, wheezing, rhonchi or rales.  Abdominal:     Palpations: Abdomen is soft.     Tenderness: There is no abdominal tenderness.  Musculoskeletal:     Right lower leg: No swelling.     Left lower leg: No swelling.  Skin:    General: Skin is warm.     Findings: No rash.  Neurological:     Mental Status: She is alert.     Comments: Answers all questions appropriately today and follows all commands.       Data Reviewed: Basic  Metabolic Panel: Recent Labs  Lab 04/15/20 0525 04/16/20 0525 04/17/20 0605 04/18/20 0939 04/19/20 0541  NA 141 144 140 143 141  K 5.3* 4.8 3.0* 3.2* 3.6  CL 111 112* 108 110 106  CO2 13* 16* 17* 21* 21*  GLUCOSE 117* 77 95 96 97  BUN 46* 56* 51* 51* 55*  CREATININE 5.62* 6.69* 6.19* 5.65* 5.54*  CALCIUM 9.5 9.3 9.0 8.4* 9.0  MG  --   --   --   --  1.9   Liver Function Tests: Recent Labs  Lab 04/13/20 0223  AST 20  ALT 11  ALKPHOS 72  BILITOT 0.6  PROT 7.3  ALBUMIN 4.1   CBC: Recent Labs  Lab 04/13/20 0223 04/13/20 0223 04/13/20 0714 04/13/20 0714 04/13/20 1915 04/13/20 1915 04/14/20 0437 04/15/20 0525 04/17/20 0605 04/18/20 0939 04/19/20 0541  WBC 10.8*  --   --   --   --   --  7.7 11.5* 11.4*  --   --   NEUTROABS 8.0*  --   --   --   --   --   --   --   --   --   --   HGB 9.1*   < > 7.7*   < > 9.1*   < >  7.7* 8.3* 8.0* 7.6* 10.2*  HCT 27.5*   < > 24.7*  --  28.5*  --  23.8* 28.4* 25.1*  --   --   MCV 102.6*  --   --   --   --   --  103.5* 113.6* 106.4*  --   --   PLT 314  --   --   --   --   --  253 268 215  --   --    < > = values in this interval not displayed.    CBG: Recent Labs  Lab 04/15/20 1028  GLUCAP 141*    Recent Results (from the past 240 hour(s))  SARS Coronavirus 2 by RT PCR (hospital order, performed in [facility] hospital lab) Nasopharyngeal Nasopharyngeal Swab     Status: None   Collection Time: 04/13/20  2:23 AM   Specimen: Nasopharyngeal Swab  Result Value Ref Range Status   SARS Coronavirus 2 NEGATIVE NEGATIVE Final    Comment: (NOTE) SARS-CoV-2 target nucleic acids are NOT DETECTED.  The SARS-CoV-2 RNA is generally detectable in upper and lower respiratory specimens during the acute phase of infection. The lowest concentration of SARS-CoV-2 viral copies this assay can detect is 250 copies / mL. A negative result does not preclude SARS-CoV-2 infection and should not be used as the sole basis for treatment or other patient  management decisions.  A negative result may occur with improper specimen collection / handling, submission of specimen other than nasopharyngeal swab, presence of viral mutation(s) within the areas targeted by this assay, and inadequate number of viral copies (<250 copies / mL). A negative result must be combined with clinical observations, patient history, and epidemiological information.  Fact Sheet for Patients:   StrictlyIdeas.no  Fact Sheet for Healthcare Providers: BankingDealers.co.za  This test is not yet approved or  cleared by the [place] FDA and has been authorized for detection and/or diagnosis of SARS-CoV-2 by FDA under an Emergency Use Authorization (EUA).  This EUA will remain in effect (meaning this test can be used) for the duration of the COVID-19 declaration under Section 564(b)(1) of the Act, 21 U.S.C. section 360bbb-3(b)(1), unless the authorization is terminated or revoked sooner.  Performed at [facility], [address], [place], [place] [address]   Urine culture     Status: Abnormal   Collection Time: 04/13/20  2:23 AM   Specimen: Urine, Random  Result Value Ref Range Status   Specimen Description   Final    URINE, RANDOM Performed at [facility], [address], [place], [place] [address]    Special Requests   Final    NONE Performed at [facility], [address] [place],  [address]    Culture (A)  Final    20,000 COLONIES/mL MULTIPLE SPECIES PRESENT, SUGGEST RECOLLECTION   Report Status 04/14/2020 FINAL  Final     Studies: MR BRAIN WO CONTRAST  Addendum Date: 04/19/2020   ADDENDUM REPORT: 04/19/2020 14:20 ADDENDUM: History: Atrial fibrillation. Confusion. Metastatic disease evaluation. Brain: The study suffers from some motion degradation. Diffusion imaging does not show any acute or subacute infarction or other cause of restricted diffusion. No focal  abnormality affects the brainstem or cerebellum. Cerebral hemispheres show moderate to marked chronic small-vessel ischemic changes of the white matter. No cortical or large vessel territory infarction. No evidence of mass lesion, hemorrhage, hydrocephalus or extra-axial collection. Vascular: Major vessels at the base of the brain show flow. Skull and upper  cervical spine: Normal. Sinuses/Orbits: Clear/normal. Other: None significant. IMPRESSION: IMPRESSION No acute or subacute finding. Moderate to marked chronic small-vessel ischemic changes affecting the cerebral hemispheric white matter. No sign of mass lesion. Electronically Signed   By: Nelson Chimes M.D.   On: 04/19/2020 14:20   Result Date: 04/19/2020 EXAM: MRI HEAD WITHOUT CONTRAST TECHNIQUE: Multiplanar, multiecho pulse sequences of the brain and surrounding structures were obtained without intravenous contrast. COMPARISON:  None. FINDINGS: Brain: Vascular: Skull and upper cervical spine: Sinuses/Orbits: Other: Electronically Signed: By: Nelson Chimes M.D. On: 04/19/2020 14:04    Scheduled Meds: . amLODipine  2.5 mg Oral Daily  . Chlorhexidine Gluconate Cloth  6 each Topical Daily  . citalopram  20 mg Oral Daily  . famotidine  10 mg Oral QHS  . hydrALAZINE  50 mg Oral TID  . latanoprost  1 drop Both Eyes QHS  . lidocaine  1 patch Transdermal Q24H  . metoprolol succinate  25 mg Oral Daily  . pantoprazole  40 mg Oral BID  . pneumococcal 23 valent vaccine  0.5 mL Intramuscular Tomorrow-1000  . polyethylene glycol  17 g Oral Daily  . sodium chloride flush  5 mL Intracatheter Q12H  . timolol  1 drop Both Eyes q AM  . traZODone  100 mg Oral QHS  . vitamin B-12  250 mcg Oral Daily   Continuous Infusions:  Assessment/Plan:  1. Acute kidney injury secondary to obstruction with bilateral hydronephrosis.  Nephrostomy tubes placed by interventional radiology on 04/15/2020.  Creatinine peaked at 6.69.  Today's creatinine at 5.54.  Was hoping for  better drop down in creatinine today.  Will restart IV fluids 50 cc/h.  Recheck creatinine tomorrow. 2. 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  The patient I think understood that she has a cancerous process at this time.  Son was concerned about her mental status MRI of the brain negative without contrast for metastases.  Spoke with Dr. Bernardo Heater urology.  Not a surgical candidate but may be a candidate for radiation and recommended outpatient oncology evaluation.  Overall prognosis is poor.  Patient at this point is a full code.  Follow-up with the pace program for further goals of care conversations as outpatient. 3. Acute blood loss anemia with hematuria.  Xarelto on hold from admission.  Transfuse 1 unit of packed red blood cells yesterday with good response hemoglobin up to 10.2 today. 4. Paroxysmal atrial fibrillation on low-dose Toprol. 5. Essential hypertension.  Continue lower dose Norvasc, hydralazine and Toprol 6. Constipation on MiraLAX 7. Cognitive issues.  MRI of the brain without contrast negative for metastases.  Did show small vessel ischemic changes. 8. Large hiatal hernia on PPI 9. Acquired thrombophilia secondary to paroxysmal atrial fibrillation.  Anticoagulation contraindicated with bleeding.       Code Status:     Code Status Orders  (From admission, onward)         Start     Ordered   04/13/20 0617  Full code  Continuous        04/13/20 0623        Code Status History    This patient has a current code status but no historical code status.   Advance Care Planning Activity    Advance Directive Documentation     Most Recent Value  Type of Advance Directive Out of facility DNR (pink MOST or yellow form)  [see pink sheet]  Pre-existing out of facility DNR order (  yellow form or pink MOST form) Pink MOST/Yellow Form most recent copy in chart - Physician notified to receive inpatient order  "MOST"  Form in Place? --     Family Communication: Spoke with son on the phone Disposition Plan: Status is: Inpatient  Dispo: The patient is from: Home              Anticipated d/c is to: Rehab              Anticipated d/c date is: Depending on tomorrow's creatinine will determine on whether or not patient can go out to rehab tomorrow or the following day but am hoping soon.  Transitional care team has to bed offers.              Patient currently being treated for acute kidney injury with obstruction has nephrostomy tubes.  Watching creatinine starting to trend better.  Consultants:  Urology  Time spent: 27 minutes  [place]

## 2020-04-20 DIAGNOSIS — Z515 Encounter for palliative care: Secondary | ICD-10-CM

## 2020-04-20 DIAGNOSIS — Z66 Do not resuscitate: Secondary | ICD-10-CM

## 2020-04-20 DIAGNOSIS — Z7189 Other specified counseling: Secondary | ICD-10-CM

## 2020-04-20 LAB — BASIC METABOLIC PANEL
Anion gap: 14 (ref 5–15)
BUN: 56 mg/dL — ABNORMAL HIGH (ref 8–23)
CO2: 21 mmol/L — ABNORMAL LOW (ref 22–32)
Calcium: 8.6 mg/dL — ABNORMAL LOW (ref 8.9–10.3)
Chloride: 105 mmol/L (ref 98–111)
Creatinine, Ser: 4.87 mg/dL — ABNORMAL HIGH (ref 0.44–1.00)
GFR calc Af Amer: 9 mL/min — ABNORMAL LOW (ref >60)
GFR calc non Af Amer: 7 mL/min — ABNORMAL LOW (ref >60)
Glucose, Bld: 96 mg/dL (ref 70–99)
Potassium: 3.3 mmol/L — ABNORMAL LOW (ref 3.5–5.1)
Sodium: 140 mmol/L (ref 135–145)

## 2020-04-20 MED ORDER — POTASSIUM CHLORIDE 20 MEQ PO PACK
40.0000 meq | PACK | Freq: Once | ORAL | Status: AC
  Administered 2020-04-20: 40 meq via ORAL
  Filled 2020-04-20: qty 2

## 2020-04-20 NOTE — Consult Note (Signed)
Consultation Note Date: 04/20/2020   Patient Name: Christy Mccann  DOB: Aug 11, 1931  MRN: [id number]  Age / Sex: 86 y.o., female  PCP: [place] Referring Physician: Shawna Clamp, MD  Reason for Consultation: Establishing goals of care  HPI/Patient Profile: 86 y.o. female  with past medical history of afib, depression, HTN, and hypothyroidism admitted on 04/13/2020 with hematuria. She was found to have invasive high-grade papillary urothelial carcinoma. Also diagnosed with AKI secondary to obstruction w/ bilateral hydronephrosis. Nephrostomy tubes were placed 8/20. Creatinine is decreasing but remains high. PMT consulted to discuss [place].     Clinical Assessment and Goals of Care: I have reviewed medical records including EPIC notes, labs and imaging, assessed the patient and then spoke with patient's son, Broadus John,  to discuss diagnosis prognosis, GOC, EOL wishes, disposition and options.  I did attempt to discuss goals of care with patient; however she was delirious. She did tell me she understood she has cancer and treatment options are limited. She also expressed desire for DNR status. Discussed further with son as below d/t AMS.  I introduced Palliative Medicine as specialized medical care for people living with serious illness. It focuses on providing relief from the symptoms and stress of a serious illness. The goal is to improve quality of life for both the patient and the family.  We discussed a brief life review of the patient. Broadus John shares that patient only has 8th grade education but has educated herself - has read 2-3 books every week for much of her life. He tells me what a wonderful mother she has been.   As far as functional and nutritional status, he tells me patient was doing great prior to hospitalization. She lived alone and was fully functional. He tells me she had a good appetite and was cognitively intact.    We  discussed patient's current illness and what it means in the larger context of patient's on-going co-morbidities.  Natural disease trajectory and expectations at EOL were discussed. Broadus John understands her cancer diagnosis and limited treatment options. He understands poor prognosis.   I attempted to elicit values and goals of care important to the patient.The difference between aggressive medical intervention and comfort care was considered in light of the patient's goals of care. Broadus John tells me he thinks his mother would like to focus on quality of life and avoid aggressive medical interventions and he agrees with this. We discussed code status. Encouraged Broadus John to consider DNR/DNI status understanding evidenced based poor outcomes in similar hospitalized patients, as the cause of the arrest is likely associated with chronic/terminal disease rather than a reversible acute cardio-pulmonary event. Broadus John agrees DNR is appropriate.  Discussed with Broadus John the importance of continued conversation with family and the medical providers regarding overall plan of care and treatment options, ensuring decisions are within the context of the patient's values and GOCs.    Hospice and Palliative Care services outpatient were explained and offered. Broadus John is interested in patient going to rehab prior to discharge home. He is also interested in focusing on comfort at home and tells me patient has PACE. He wonders if PACE can help with these wishes.  I called [facility]ACE case manager and shared above [place] conversation with her.   Questions and concerns were addressed. The family was encouraged to call with questions or concerns.   Primary Decision Maker NEXT OF KIN - Son Luci Bank    SUMMARY OF RECOMMENDATIONS   - code status changed to DNR -  to rehab but interested in comfort approach at home - discussed with [facility] case manager - will not consult hospice/palliative as case manager tells me they will arrange this -  delirium precautions  Code Status/Advance Care Planning:  DNR  Discharge Planning: [place] for rehab with Palliative care service follow-up      Primary Diagnoses: Present on Admission: . Acute kidney injury (AKI) with acute tubular necrosis (ATN) (HCC) . Acute kidney injury ([address])   I have reviewed the medical record, interviewed the patient and family, and examined the patient. The following aspects are pertinent.  Past Medical History:  Diagnosis Date  . A-fib ([place])   . Bradycardia   . Depression   . Hypertension   . Hypothyroid    Social History   Socioeconomic History  . Marital status: Widowed    Spouse name: Not on file  . Number of children: Not on file  . Years of education: Not on file  . Highest education level: Not on file  Occupational History  . Not on file  Tobacco Use  . Smoking status: Never Smoker  . Smokeless tobacco: Never Used  Vaping Use  . Vaping Use: Never used  Substance and Sexual Activity  . Alcohol use: Not on file  . Drug use: Never  . Sexual activity: Not Currently  Other Topics Concern  . Not on file  Social History Narrative  . Not on file   Social Determinants of Health   Financial Resource Strain:   . Difficulty of Paying Living Expenses: Not on file  Food Insecurity:   . Worried About Charity fundraiser in the Last Year: Not on file  . Ran Out of Food in the Last Year: Not on file  Transportation Needs:   . Lack of Transportation (Medical): Not on file  . Lack of Transportation (Non-Medical): Not on file  Physical Activity:   . Days of Exercise per Week: Not on file  . Minutes of Exercise per Session: Not on file  Stress:   . Feeling of Stress : Not on file  Social Connections:   . Frequency of Communication with Friends and Family: Not on file  . Frequency of Social Gatherings with Friends and Family: Not on file  . Attends Religious Services: Not on file  . Active Member of Clubs or  Organizations: Not on file  . Attends Archivist Meetings: Not on file  . Marital Status: Not on file   History reviewed. No pertinent family history. Scheduled Meds: . amLODipine  2.5 mg Oral Daily  . Chlorhexidine Gluconate Cloth  6 each Topical Daily  . citalopram  20 mg Oral Daily  . famotidine  10 mg Oral QHS  . hydrALAZINE  50 mg Oral TID  . latanoprost  1 drop Both Eyes QHS  . lidocaine  1 patch Transdermal Q24H  . metoprolol succinate  25 mg Oral Daily  . pantoprazole  40 mg Oral BID  . pneumococcal 23 valent vaccine  0.5 mL Intramuscular Tomorrow-1000  . polyethylene glycol  17 g Oral Daily  . sodium chloride flush  5 mL Intracatheter Q12H  . timolol  1 drop Both Eyes q AM  . traZODone  100 mg Oral QHS  . vitamin B-12  250 mcg Oral Daily   Continuous Infusions: . sodium chloride 50 mL/hr at 04/19/20 1741   PRN Meds:.acetaminophen **OR** acetaminophen, bisacodyl, labetalol, magnesium hydroxide, metoprolol tartrate, ondansetron **OR** ondansetron (ZOFRAN) IV, polyvinyl alcohol Allergies  Allergen Reactions  . Gabapentin Other (See Comments)    Unknown effects   Review of Systems  Unable to perform ROS: Mental status change    Physical Exam Constitutional:      General: She is not in acute distress. Pulmonary:     Effort: Pulmonary effort is normal. No respiratory distress.  Abdominal:     Palpations: Abdomen is soft.  Skin:    General: Skin is warm and dry.  Neurological:     Mental Status: She is alert. She is disoriented.     Vital Signs: BP (!) 160/85   Pulse 72   Temp 98.7 F (37.1 C) (Oral)   Resp 18   Ht 5' (1.524 m)   Wt 49.9 kg   SpO2 99%   BMI 21.48 kg/m  Pain Scale: 0-10   Pain Score: 0-No pain   SpO2: SpO2: 99 % O2 Device:SpO2: 99 % O2 Flow Rate: .O2 Flow Rate (L/min): 3 L/min  IO: Intake/output summary:   Intake/Output Summary (Last 24 hours) at 04/20/2020 1251 Last data filed at 04/20/2020 2637 Gross per 24 hour    Intake 688.67 ml  Output 1440 ml  Net -751.33 ml    LBM: Last BM Date: 04/18/20 Baseline Weight: Weight: 45.4 kg Most recent weight: Weight: 49.9 kg     Palliative Assessment/Data: PPS 50%    Time Total: 60 minutes Greater than 50%  of this time was spent counseling and coordinating care related to the above assessment and plan.  Juel Burrow, DNP, AGNP-C Palliative Medicine Team [phone] Pager: [phone]

## 2020-04-20 NOTE — Progress Notes (Signed)
PROGRESS NOTE    Christy Mccann  BSJ:[id number] DOB: 18-Jan-1931 DOA: 04/13/2020 PCP: [facility]   Brief Narrative:  Christy Mccann  is a 86 y.o. slim Caucasian female with a known history of atrial fibrillation, depression, hypertension and hypothyroidism, who presented to the emergency room with acute onset of hematuria which has been intermittent over the last week.  The patient has been on Eliquis for several months for her atrial fibrillation.  She has been having occasional mild fatigue and tiredness.  She denies any abdominal or flank pain.  No fever or chills.  She has been having headache without dizziness or blurred vision.  No chest pain or dyspnea or cough or wheezing or hemoptysis.  No other bleeding diathesis. Admitted with hematuria. Found to have obstructive acute kidney injury secondary to cancerous bladder mass. Has bilateral nephrostomy tubes. Awaiting creatinine to come down a little bit more prior to disposition. Will go out to rehab. Follows with [facility]. Will need outpatient oncology evaluation.  Assessment & Plan:   Active Problems:   Acute kidney injury (AKI) with acute tubular necrosis (ATN) (HCC)   Hydronephrosis   Acute on chronic blood loss anemia   Acute kidney injury ([place])   Essential hypertension   AF (paroxysmal atrial fibrillation) (HCC)   Bladder mass   Hyperkalemia   Acquired thrombophilia ([place])   Malignant neoplasm of urinary bladder ([place])   1. Acute kidney injury secondary to obstruction with bilateral hydronephrosis:  Nephrostomy tubes placed by interventional radiology on 04/15/2020.  Creatinine peaked at 6.69.  Today's creatinine at 4.87.  Was hoping for better drop down in creatinine .  Will restart IV fluids 50 cc/h.  Recheck creatinine tomorrow.  2. 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  The patient understood that she has a cancerous process at this time.   Son was concerned about her mental status,  MRI of the brain negative without contrast for metastases.  Spoke with Dr. Bernardo Heater urology.  Not a surgical candidate but may be a candidate for radiation and recommended outpatient oncology evaluation.  Overall prognosis is poor.  Patient at this point is a full code.  Follow-up with the [facility] for further goals of care conversations as outpatient.  3. Acute blood loss anemia with hematuria.  Xarelto on hold from admission.  Transfused 1 unit of packed red blood cells yesterday with good response hemoglobin up to 10.2 today.  4. Paroxysmal atrial fibrillation on low-dose Toprol.  5. Essential hypertension.  Continue lower dose Norvasc, hydralazine and Toprol.  6. Constipation on MiraLAX.  7. Cognitive issues.  MRI of the brain without contrast negative for metastases.  Did show small vessel ischemic changes.  8. Large hiatal hernia on PPI 9. Acquired thrombophilia secondary to paroxysmal atrial fibrillation.  Anticoagulation contraindicated with bleeding.    DVT prophylaxis: SCDS Code Status: Full code Family Communication: spoke with son on phone. Disposition Plan:  Dispo: The patient is from: Home  Anticipated d/c is to: Rehab  Anticipated d/c date is: Depending on tomorrow's creatinine will determine on whether or not patient can go out to rehab tomorrow or the following day but am hoping soon.  Transitional care team has to bed offers.  Patient currently being treated for acute kidney injury with obstruction has nephrostomy tubes.  Watching creatinine starting to trend better.  Consultants:   Urology   Procedures:    Antimicrobials:  Anti-infectives (From admission, onward)  Start     Dose/Rate Route Frequency Ordered Stop   04/15/20 1630  ceFAZolin (ANCEF) IVPB 2g/100 mL premix        2 g 200 mL/hr over 30 Minutes Intravenous  Once 04/15/20 1534 04/15/20 1514   04/13/20 1400   cefTRIAXone (ROCEPHIN) 1 g in sodium chloride 0.9 % 100 mL IVPB  Status:  Discontinued        1 g 200 mL/hr over 30 Minutes Intravenous Every 24 hours 04/13/20 1328 04/14/20 1847     Subjective: Patient was seen and examined at bedside.  She is alert,  was able to understand about her condition.  She does have waxing and waning in her mental status.  Objective: Vitals:   04/19/20 1140 04/19/20 1656 04/19/20 2013 04/20/20 0558  BP: 136/71 (!) 136/53 (!) 156/75 (!) 160/85  Pulse: 89 75 79 72  Resp: 18 20  18   Temp: 97.8 F (36.6 C) 97.6 F (36.4 C) 99.1 F (37.3 C) 98.7 F (37.1 C)  TempSrc:  Oral Oral Oral  SpO2: 99% 96% 98% 99%  Weight:      Height:        Intake/Output Summary (Last 24 hours) at 04/20/2020 1445 Last data filed at 04/20/2020 1329 Gross per 24 hour  Intake 1038.67 ml  Output 1190 ml  Net -151.33 ml   Filed Weights   04/14/20 2309 04/18/20 0407 04/19/20 0426  Weight: 50.8 kg 50.1 kg 49.9 kg    Examination:  General exam: Appears calm and comfortable  Respiratory system: Clear to auscultation. Respiratory effort normal. Cardiovascular system: S1 & S2 heard, RRR. No JVD, murmurs, rubs, gallops or clicks. No pedal edema. Gastrointestinal system: Abdomen is nondistended, soft and nontender. No organomegaly or masses felt. Normal bowel sounds heard. Central nervous system: Alert and oriented. No focal neurological deficits. Extremities: Symmetric 5 x 5 power. Skin: No rashes, lesions or ulcers Psychiatry: Judgement and insight appear normal. Mood & affect appropriate.     Data Reviewed: I have personally reviewed following labs and imaging studies  CBC: Recent Labs  Lab 04/13/20 1915 04/13/20 1915 04/14/20 0437 04/15/20 0525 04/17/20 0605 04/18/20 0939 04/19/20 0541  WBC  --   --  7.7 11.5* 11.4*  --   --   HGB 9.1*   < > 7.7* 8.3* 8.0* 7.6* 10.2*  HCT 28.5*  --  23.8* 28.4* 25.1*  --   --   MCV  --   --  103.5* 113.6* 106.4*  --   --   PLT   --   --  253 268 215  --   --    < > = values in this interval not displayed.   Basic Metabolic Panel: Recent Labs  Lab 04/16/20 0525 04/17/20 0605 04/18/20 0939 04/19/20 0541 04/20/20 0451  NA 144 140 143 141 140  K 4.8 3.0* 3.2* 3.6 3.3*  CL 112* 108 110 106 105  CO2 16* 17* 21* 21* 21*  GLUCOSE 77 95 96 97 96  BUN 56* 51* 51* 55* 56*  CREATININE 6.69* 6.19* 5.65* 5.54* 4.87*  CALCIUM 9.3 9.0 8.4* 9.0 8.6*  MG  --   --   --  1.9  --    GFR: Estimated Creatinine Clearance: 5.8 mL/min (A) (by C-G formula based on SCr of 4.87 mg/dL (H)). Liver Function Tests: No results for input(s): AST, ALT, ALKPHOS, BILITOT, PROT, ALBUMIN in the last 168 hours. No results for input(s): LIPASE, AMYLASE in the last 168 hours. No  results for input(s): AMMONIA in the last 168 hours. Coagulation Profile: No results for input(s): INR, PROTIME in the last 168 hours. Cardiac Enzymes: No results for input(s): CKTOTAL, CKMB, CKMBINDEX, TROPONINI in the last 168 hours. BNP (last 3 results) No results for input(s): PROBNP in the last 8760 hours. HbA1C: No results for input(s): HGBA1C in the last 72 hours. CBG: Recent Labs  Lab 04/15/20 1028  GLUCAP 141*   Lipid Profile: No results for input(s): CHOL, HDL, LDLCALC, TRIG, CHOLHDL, LDLDIRECT in the last 72 hours. Thyroid Function Tests: No results for input(s): TSH, T4TOTAL, FREET4, T3FREE, THYROIDAB in the last 72 hours. Anemia Panel: No results for input(s): VITAMINB12, FOLATE, FERRITIN, TIBC, IRON, RETICCTPCT in the last 72 hours. Sepsis Labs: No results for input(s): PROCALCITON, LATICACIDVEN in the last 168 hours.  Recent Results (from the past 240 hour(s))  SARS Coronavirus 2 by RT PCR (hospital order, performed in [facility] hospital lab) Nasopharyngeal Nasopharyngeal Swab     Status: None   Collection Time: 04/13/20  2:23 AM   Specimen: Nasopharyngeal Swab  Result Value Ref Range Status   SARS Coronavirus 2 NEGATIVE NEGATIVE Final      Comment: (NOTE) SARS-CoV-2 target nucleic acids are NOT DETECTED.  The SARS-CoV-2 RNA is generally detectable in upper and lower respiratory specimens during the acute phase of infection. The lowest concentration of SARS-CoV-2 viral copies this assay can detect is 250 copies / mL. A negative result does not preclude SARS-CoV-2 infection and should not be used as the sole basis for treatment or other patient management decisions.  A negative result may occur with improper specimen collection / handling, submission of specimen other than nasopharyngeal swab, presence of viral mutation(s) within the areas targeted by this assay, and inadequate number of viral copies (<250 copies / mL). A negative result must be combined with clinical observations, patient history, and epidemiological information.  Fact Sheet for Patients:   StrictlyIdeas.no  Fact Sheet for Healthcare Providers: BankingDealers.co.za  This test is not yet approved or  cleared by the [place] FDA and has been authorized for detection and/or diagnosis of SARS-CoV-2 by FDA under an Emergency Use Authorization (EUA).  This EUA will remain in effect (meaning this test can be used) for the duration of the COVID-19 declaration under Section 564(b)(1) of the Act, 21 U.S.C. section 360bbb-3(b)(1), unless the authorization is terminated or revoked sooner.  Performed at [facility], [address] [place], [place] [address]   Urine culture     Status: Abnormal   Collection Time: 04/13/20  2:23 AM   Specimen: Urine, Random  Result Value Ref Range Status   Specimen Description   Final    URINE, RANDOM Performed at [facility], [address], [place], [place] [address]    Special Requests   Final    NONE Performed at [facility], [address] [place], [place] [address]    Culture (A)  Final    20,000 COLONIES/mL MULTIPLE SPECIES  PRESENT, SUGGEST RECOLLECTION   Report Status 04/14/2020 FINAL  Final     Radiology Studies: MR BRAIN WO CONTRAST  Addendum Date: 04/19/2020   ADDENDUM REPORT: 04/19/2020 14:20 ADDENDUM: History: Atrial fibrillation. Confusion. Metastatic disease evaluation. Brain: The study suffers from some motion degradation. Diffusion imaging does not show any acute or subacute infarction or other cause of restricted diffusion. No focal abnormality affects the brainstem or cerebellum. Cerebral hemispheres show moderate to marked chronic small-vessel ischemic changes of the white matter.  No cortical or large vessel territory infarction. No evidence of mass lesion, hemorrhage, hydrocephalus or extra-axial collection. Vascular: Major vessels at the base of the brain show flow. Skull and upper cervical spine: Normal. Sinuses/Orbits: Clear/normal. Other: None significant. IMPRESSION: IMPRESSION No acute or subacute finding. Moderate to marked chronic small-vessel ischemic changes affecting the cerebral hemispheric white matter. No sign of mass lesion. Electronically Signed   By: Nelson Chimes M.D.   On: 04/19/2020 14:20   Result Date: 04/19/2020 EXAM: MRI HEAD WITHOUT CONTRAST TECHNIQUE: Multiplanar, multiecho pulse sequences of the brain and surrounding structures were obtained without intravenous contrast. COMPARISON:  None. FINDINGS: Brain: Vascular: Skull and upper cervical spine: Sinuses/Orbits: Other: Electronically Signed: By: Nelson Chimes M.D. On: 04/19/2020 14:04        Scheduled Meds: . amLODipine  2.5 mg Oral Daily  . Chlorhexidine Gluconate Cloth  6 each Topical Daily  . citalopram  20 mg Oral Daily  . famotidine  10 mg Oral QHS  . hydrALAZINE  50 mg Oral TID  . latanoprost  1 drop Both Eyes QHS  . lidocaine  1 patch Transdermal Q24H  . metoprolol succinate  25 mg Oral Daily  . pantoprazole  40 mg Oral BID  . pneumococcal 23 valent vaccine  0.5 mL Intramuscular Tomorrow-1000  . polyethylene  glycol  17 g Oral Daily  . sodium chloride flush  5 mL Intracatheter Q12H  . timolol  1 drop Both Eyes q AM  . traZODone  100 mg Oral QHS  . vitamin B-12  250 mcg Oral Daily   Continuous Infusions: . sodium chloride 50 mL/hr at 04/19/20 1741     LOS: 7 days    Time spent: [place], MD [facility]   If 7PM-7AM, please contact night-coverage

## 2020-04-20 NOTE — Progress Notes (Signed)
Patient very confused, impulsive constantly getting out of bed, call family to see if they can come to sit with her, said will call me back, patient on low bed, mat on the floor bed and chair alarm on.

## 2020-04-20 NOTE — Progress Notes (Addendum)
Occupational Therapy Treatment Patient Details Name: Christy Mccann MRN: [id number] DOB: 25-Aug-1931 Today's Date: 04/20/2020    History of present illness 86 y.o. female  with past medical history of afib, depression, HTN, and hypothyroidism admitted on 04/13/2020 with hematuria. She was found to have invasive high-grade papillary urothelial carcinoma. Also diagnosed with AKI secondary to obstruction w/ bilateral hydronephrosis. Nephrostomy tubes were placed 8/20.   OT comments  Pt seen for OT tx this date to f/u re: safety with ADLs/ADL mobility. Pt appears to have increased confusion this date, noted to have mitts on b/l hands when OT presents. OT engages pt in sup to sit transition with MIN A. While seated, engaged pt in grooming with setup and MIN A for LB dressing. Pt requires safety cueing throughout as she presents somewhat anxious and moves drains/leads. Pt able to complete transfer trials x2 from EOB<>stand with MIN A with RW with cues for hand placement. Pt able to follow cues, but requires consistent, gentle re-direction. As pt presents more confused this date and continues to require assistance with mobility/self care, updated d/c recommendation to SNF.    Follow Up Recommendations  SNF    Equipment Recommendations  Other (comment) (defer to next level of care)    Recommendations for Other Services      Precautions / Restrictions Precautions Precautions: Fall Restrictions Weight Bearing Restrictions: No       Mobility Bed Mobility Overal bed mobility: Needs Assistance Bed Mobility: Supine to Sit     Supine to sit: Min assist     General bed mobility comments: cues for safety, MIN A for stability and line/drain mgt  Transfers Overall transfer level: Needs assistance Equipment used: Rolling walker (2 wheeled) Transfers: Sit to/from Stand Sit to Stand: Min assist         General transfer comment: Verbal cues for safety/hand placement    Balance Overall balance  assessment: Needs assistance Sitting-balance support: Feet supported Sitting balance-Leahy Scale: Good     Standing balance support: Single extremity supported;During functional activity Standing balance-Leahy Scale: Fair Standing balance comment: reliant on UE support on RW or HHA                           ADL either performed or assessed with clinical judgement   ADL Overall ADL's : Needs assistance/impaired     Grooming: Set up;Sitting;Cueing for sequencing Grooming Details (indicate cue type and reason): pt appears more confused this date, requires more cues             Lower Body Dressing: Minimal assistance;Sitting/lateral leans;Cueing for safety;Cueing for sequencing Lower Body Dressing Details (indicate cue type and reason): seated to thread socks                     Vision Baseline Vision/History: Wears glasses Wears Glasses: Reading only Patient Visual Report: No change from baseline     Perception     Praxis      Cognition Arousal/Alertness: Awake/alert Behavior During Therapy: WFL for tasks assessed/performed;Agitated;Anxious Overall Cognitive Status: No family/caregiver present to determine baseline cognitive functioning                                 General Comments: Pt more confused this date, keeps mentioning seeing her son's green truck when looking in the mirror. Pt with mitts on when OT presents that are replaced  when session ends. Pt very anxious and tugging at lines/leads (during session while mitts removed) intermittently requiring cues.        Exercises     Shoulder Instructions       General Comments      Pertinent Vitals/ Pain       Pain Assessment: No/denies pain  Home Living                                          Prior Functioning/Environment              Frequency           Progress Toward Goals  OT Goals(current goals can now be found in the care plan  section)  Progress towards OT goals: Progressing toward goals  Acute Rehab OT Goals Patient Stated Goal: none stated OT Goal Formulation: With patient Time For Goal Achievement: 04/30/20 Potential to Achieve Goals: [place] Discharge plan needs to be updated    Co-evaluation                 AM-PAC OT "6 Clicks" Daily Activity     Outcome Measure   Help from another person eating meals?: A Little Help from another person taking care of personal grooming?: A Little Help from another person toileting, which includes using toliet, bedpan, or urinal?: A Little Help from another person bathing (including washing, rinsing, drying)?: A Little Help from another person to put on and taking off regular upper body clothing?: A Little Help from another person to put on and taking off regular lower body clothing?: A Little 6 Click Score: 18    End of Session Equipment Utilized During Treatment: Rolling walker;Gait belt  OT Visit Diagnosis: Unsteadiness on feet (R26.81);Other abnormalities of gait and mobility (R26.89);Muscle weakness (generalized) (M62.81)   Activity Tolerance Patient tolerated treatment well   Patient Left in bed;with call bell/phone within reach;with bed alarm set;Other (comment) (with mitts re-applied)   Nurse Communication          Time: 1036-1100 OT Time Calculation (min): 24 min  Charges: OT General Charges $OT Visit: 1 Visit OT Treatments $Self Care/Home Management : 8-22 mins $Therapeutic Activity: 8-22 mins  Gerrianne Scale, [place], OTR/L ascom [phone] 04/20/20, 3:37 PM

## 2020-04-20 NOTE — Plan of Care (Signed)
  Problem: Clinical Measurements: Goal: Will remain free from infection Outcome: Progressing   Problem: Elimination: Goal: Will not experience complications related to urinary retention Outcome: Progressing   Problem: Safety: Goal: Ability to remain free from injury will improve Outcome: Progressing   

## 2020-04-21 LAB — BASIC METABOLIC PANEL
Anion gap: 14 (ref 5–15)
BUN: 50 mg/dL — ABNORMAL HIGH (ref 8–23)
CO2: 20 mmol/L — ABNORMAL LOW (ref 22–32)
Calcium: 8.8 mg/dL — ABNORMAL LOW (ref 8.9–10.3)
Chloride: 107 mmol/L (ref 98–111)
Creatinine, Ser: 4.05 mg/dL — ABNORMAL HIGH (ref 0.44–1.00)
GFR calc Af Amer: 11 mL/min — ABNORMAL LOW (ref >60)
GFR calc non Af Amer: 9 mL/min — ABNORMAL LOW (ref >60)
Glucose, Bld: 86 mg/dL (ref 70–99)
Potassium: 3.4 mmol/L — ABNORMAL LOW (ref 3.5–5.1)
Sodium: 141 mmol/L (ref 135–145)

## 2020-04-21 LAB — PHOSPHORUS: Phosphorus: 3.7 mg/dL (ref 2.5–4.6)

## 2020-04-21 LAB — CBC
HCT: 28.3 % — ABNORMAL LOW (ref 36.0–46.0)
Hemoglobin: 9.8 g/dL — ABNORMAL LOW (ref 12.0–15.0)
MCH: 33.2 pg (ref 26.0–34.0)
MCHC: 34.6 g/dL (ref 30.0–36.0)
MCV: 95.9 fL (ref 80.0–100.0)
Platelets: 281 10*3/uL (ref 150–400)
RBC: 2.95 MIL/uL — ABNORMAL LOW (ref 3.87–5.11)
RDW: 14.5 % (ref 11.5–15.5)
WBC: 9.2 10*3/uL (ref 4.0–10.5)
nRBC: 0 % (ref 0.0–0.2)

## 2020-04-21 LAB — MAGNESIUM: Magnesium: 1.9 mg/dL (ref 1.7–2.4)

## 2020-04-21 MED ORDER — POTASSIUM CHLORIDE 20 MEQ PO PACK
40.0000 meq | PACK | Freq: Once | ORAL | Status: AC
  Administered 2020-04-21: 40 meq via ORAL
  Filled 2020-04-21: qty 2

## 2020-04-21 NOTE — Progress Notes (Signed)
PROGRESS NOTE    VINCENTA STEFFEY  OXB:[id number] DOB: [birth year] DOA: 04/13/2020 PCP: [facility]   Brief Narrative:  Gerturde Kuba  is a 86 y.o. slim Caucasian female with a known history of atrial fibrillation, depression, hypertension and hypothyroidism, who presented to the emergency room with acute onset of hematuria which has been intermittent over the last week.  The patient has been on Eliquis for several months for her atrial fibrillation.  She has been having occasional mild fatigue and tiredness.  She denies any abdominal or flank pain.  No fever or chills.  She has been having headache without dizziness or blurred vision.  No chest pain or dyspnea or cough or wheezing or hemoptysis.  No other bleeding diathesis. Admitted with hematuria. Found to have obstructive acute kidney injury secondary to cancerous bladder mass. Has bilateral nephrostomy tubes. Awaiting creatinine to come down a little bit more prior to disposition. Will go out to rehab. Follows with the [facility]. Will need outpatient oncology evaluation.  Assessment & Plan:   Active Problems:   Acute kidney injury (AKI) with acute tubular necrosis (ATN) (HCC)   Hydronephrosis   Acute on chronic blood loss anemia   Acute kidney injury ([place])   Essential hypertension   AF (paroxysmal atrial fibrillation) (HCC)   Bladder mass   Hyperkalemia   Acquired thrombophilia ([place])   Malignant neoplasm of urinary bladder (HCC)   Goals of care, counseling/discussion   DNR (do not resuscitate)   Palliative care by specialist   1. Acute kidney injury secondary to obstruction with bilateral hydronephrosis:  Nephrostomy tubes placed by interventional radiology on 04/15/2020.  Creatinine peaked at 6.69.  Today's creatinine at 4.01.  Was hoping for better drop down in creatinine .  Will restart IV fluids 50 cc/h.  Recheck creatinine tomorrow.  2. 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  The patient understood that she has a cancerous process at this time.  Son was concerned about her mental status,  MRI of the brain negative without contrast for metastases.  Spoke with Dr. Bernardo Heater urology.  Not a surgical candidate but may be a candidate for radiation and recommended outpatient oncology evaluation.  Overall prognosis is poor.  Patient at this point is a full code.  Follow-up with the [facility] program for further goals of care conversations as outpatient.  3. Acute blood loss anemia with hematuria.  Xarelto on hold from admission.  Transfused 1 unit of packed red blood cells with good response hemoglobin up to 10.2 today.  4. Paroxysmal atrial fibrillation on low-dose Toprol.  5. Essential hypertension.  Continue lower dose Norvasc, hydralazine and Toprol.  6. Constipation on MiraLAX.  7. Cognitive issues.  MRI of the brain without contrast negative for metastases.  Did show small vessel ischemic changes.  8. Large hiatal hernia on PPI 9. Acquired thrombophilia secondary to paroxysmal atrial fibrillation.  Anticoagulation contraindicated with bleeding.    DVT prophylaxis: SCDS Code Status: Full code Family Communication: spoke with son on phone. Disposition Plan:  Dispo: The patient is from: Home  Anticipated d/c is to: Rehab  Anticipated d/c date is: Depending on tomorrow's creatinine will determine on whether or not patient can go out to rehab tomorrow or the following day but am hoping soon.  Transitional care team has to bed offers.  Patient currently being treated for acute kidney injury with obstruction has nephrostomy tubes.  Watching creatinine starting to trend better.  Consultants:   Urology   Procedures:    Antimicrobials:  Anti-infectives (From admission, onward)   Start     Dose/Rate Route Frequency Ordered Stop   04/15/20 1630  ceFAZolin (ANCEF) IVPB 2g/100 mL premix        2 g 200 mL/hr  over 30 Minutes Intravenous  Once 04/15/20 1534 04/15/20 1514   04/13/20 1400  cefTRIAXone (ROCEPHIN) 1 g in sodium chloride 0.9 % 100 mL IVPB  Status:  Discontinued        1 g 200 mL/hr over 30 Minutes Intravenous Every 24 hours 04/13/20 1328 04/14/20 1847     Subjective: Patient was seen and examined at bedside.  Overnight events noted.  She is alert,  was able to understand about her condition.   She does have waxing and waning in her mental status.  Objective: Vitals:   04/20/20 1100 04/21/20 0550 04/21/20 0847 04/21/20 1123  BP: 132/82 114/87 (!) 154/71 (!) 150/80  Pulse: 74 82 89 73  Resp: 19 17 18 18   Temp: 98.4 F (36.9 C) 97.6 F (36.4 C) 98.5 F (36.9 C) 98.9 F (37.2 C)  TempSrc:  Oral Oral Oral  SpO2: 95% 100% 100% 100%  Weight:      Height:        Intake/Output Summary (Last 24 hours) at 04/21/2020 1550 Last data filed at 04/21/2020 1243 Gross per 24 hour  Intake 1233.52 ml  Output 800 ml  Net 433.52 ml   Filed Weights   04/14/20 2309 04/18/20 0407 04/19/20 0426  Weight: 50.8 kg 50.1 kg 49.9 kg    Examination:  General exam: Appears calm and comfortable  Respiratory system: Clear to auscultation. Respiratory effort normal. Cardiovascular system: S1 & S2 heard, RRR. No JVD, murmurs, rubs, gallops or clicks. No pedal edema. Gastrointestinal system: Abdomen is nondistended, soft and nontender. No organomegaly or masses felt. Normal bowel sounds heard. Central nervous system: Alert and oriented. No focal neurological deficits. BACK: Bilateral nephrostomy tubes noted. Extremities: Symmetric 5 x 5 power. Skin: No rashes, lesions or ulcers Psychiatry: Judgement and insight appear normal. Mood & affect appropriate.     Data Reviewed: I have personally reviewed following labs and imaging studies  CBC: Recent Labs  Lab 04/15/20 0525 04/17/20 0605 04/18/20 0939 04/19/20 0541 04/21/20 0552  WBC 11.5* 11.4*  --   --  9.2  HGB 8.3* 8.0* 7.6* 10.2* 9.8*    HCT 28.4* 25.1*  --   --  28.3*  MCV 113.6* 106.4*  --   --  95.9  PLT 268 215  --   --  440   Basic Metabolic Panel: Recent Labs  Lab 04/17/20 0605 04/18/20 0939 04/19/20 0541 04/20/20 0451 04/21/20 0552  NA 140 143 141 140 141  K 3.0* 3.2* 3.6 3.3* 3.4*  CL 108 110 106 105 107  CO2 17* 21* 21* 21* 20*  GLUCOSE 95 96 97 96 86  BUN 51* 51* 55* 56* 50*  CREATININE 6.19* 5.65* 5.54* 4.87* 4.05*  CALCIUM 9.0 8.4* 9.0 8.6* 8.8*  MG  --   --  1.9  --  1.9  PHOS  --   --   --   --  3.7   GFR: Estimated Creatinine Clearance: 7 mL/min (A) (by C-G formula based on SCr of 4.05 mg/dL (H)). Liver Function Tests: No results for input(s): AST, ALT, ALKPHOS, BILITOT, PROT, ALBUMIN in the last 168 hours. No results for input(s): LIPASE, AMYLASE in the last 168 hours. No results  for input(s): AMMONIA in the last 168 hours. Coagulation Profile: No results for input(s): INR, PROTIME in the last 168 hours. Cardiac Enzymes: No results for input(s): CKTOTAL, CKMB, CKMBINDEX, TROPONINI in the last 168 hours. BNP (last 3 results) No results for input(s): PROBNP in the last 8760 hours. HbA1C: No results for input(s): HGBA1C in the last 72 hours. CBG: Recent Labs  Lab 04/15/20 1028  GLUCAP 141*   Lipid Profile: No results for input(s): CHOL, HDL, LDLCALC, TRIG, CHOLHDL, LDLDIRECT in the last 72 hours. Thyroid Function Tests: No results for input(s): TSH, T4TOTAL, FREET4, T3FREE, THYROIDAB in the last 72 hours. Anemia Panel: No results for input(s): VITAMINB12, FOLATE, FERRITIN, TIBC, IRON, RETICCTPCT in the last 72 hours. Sepsis Labs: No results for input(s): PROCALCITON, LATICACIDVEN in the last 168 hours.  Recent Results (from the past 240 hour(s))  SARS Coronavirus 2 by RT PCR (hospital order, performed in [facility] hospital lab) Nasopharyngeal Nasopharyngeal Swab     Status: None   Collection Time: 04/13/20  2:23 AM   Specimen: Nasopharyngeal Swab  Result Value Ref Range Status    SARS Coronavirus 2 NEGATIVE NEGATIVE Final    Comment: (NOTE) SARS-CoV-2 target nucleic acids are NOT DETECTED.  The SARS-CoV-2 RNA is generally detectable in upper and lower respiratory specimens during the acute phase of infection. The lowest concentration of SARS-CoV-2 viral copies this assay can detect is 250 copies / mL. A negative result does not preclude SARS-CoV-2 infection and should not be used as the sole basis for treatment or other patient management decisions.  A negative result may occur with improper specimen collection / handling, submission of specimen other than nasopharyngeal swab, presence of viral mutation(s) within the areas targeted by this assay, and inadequate number of viral copies (<250 copies / mL). A negative result must be combined with clinical observations, patient history, and epidemiological information.  Fact Sheet for Patients:   StrictlyIdeas.no  Fact Sheet for Healthcare Providers: BankingDealers.co.za  This test is not yet approved or  cleared by the [place] FDA and has been authorized for detection and/or diagnosis of SARS-CoV-2 by FDA under an Emergency Use Authorization (EUA).  This EUA will remain in effect (meaning this test can be used) for the duration of the COVID-19 declaration under Section 564(b)(1) of the Act, 21 U.S.C. section 360bbb-3(b)(1), unless the authorization is terminated or revoked sooner.  Performed at [facility], [address], [place], [place] [address]   Urine culture     Status: Abnormal   Collection Time: 04/13/20  2:23 AM   Specimen: Urine, Random  Result Value Ref Range Status   Specimen Description   Final    URINE, RANDOM Performed at [facility], [address] [place], [place] [address]    Special Requests   Final    NONE Performed at [facility], [address] [place], [place] [address]    Culture (A)   Final    20,000 COLONIES/mL MULTIPLE SPECIES PRESENT, SUGGEST RECOLLECTION   Report Status 04/14/2020 FINAL  Final     Radiology Studies: No results found.   Scheduled Meds: . amLODipine  2.5 mg Oral Daily  . Chlorhexidine Gluconate Cloth  6 each Topical Daily  . citalopram  20 mg Oral Daily  . famotidine  10 mg Oral QHS  . hydrALAZINE  50 mg Oral TID  . latanoprost  1 drop Both Eyes QHS  . lidocaine  1 patch Transdermal Q24H  . metoprolol succinate  25 mg Oral Daily  . pantoprazole  40 mg Oral BID  . pneumococcal 23 valent vaccine  0.5 mL Intramuscular Tomorrow-1000  . polyethylene glycol  17 g Oral Daily  . sodium chloride flush  5 mL Intracatheter Q12H  . timolol  1 drop Both Eyes q AM  . traZODone  100 mg Oral QHS  . vitamin B-12  250 mcg Oral Daily   Continuous Infusions: . sodium chloride 50 mL/hr at 04/19/20 1741     LOS: 8 days    Time spent: [place], MD [facility]   If 7PM-7AM, please contact night-coverage

## 2020-04-22 LAB — CBC
HCT: 27.8 % — ABNORMAL LOW (ref 36.0–46.0)
Hemoglobin: 9 g/dL — ABNORMAL LOW (ref 12.0–15.0)
MCH: 32.4 pg (ref 26.0–34.0)
MCHC: 32.4 g/dL (ref 30.0–36.0)
MCV: 100 fL (ref 80.0–100.0)
Platelets: 265 10*3/uL (ref 150–400)
RBC: 2.78 MIL/uL — ABNORMAL LOW (ref 3.87–5.11)
RDW: 14.5 % (ref 11.5–15.5)
WBC: 7.9 10*3/uL (ref 4.0–10.5)
nRBC: 0 % (ref 0.0–0.2)

## 2020-04-22 LAB — BASIC METABOLIC PANEL
Anion gap: 6 (ref 5–15)
BUN: 40 mg/dL — ABNORMAL HIGH (ref 8–23)
CO2: 24 mmol/L (ref 22–32)
Calcium: 8.6 mg/dL — ABNORMAL LOW (ref 8.9–10.3)
Chloride: 111 mmol/L (ref 98–111)
Creatinine, Ser: 3.64 mg/dL — ABNORMAL HIGH (ref 0.44–1.00)
GFR calc Af Amer: 12 mL/min — ABNORMAL LOW (ref >60)
GFR calc non Af Amer: 11 mL/min — ABNORMAL LOW (ref >60)
Glucose, Bld: 96 mg/dL (ref 70–99)
Potassium: 3.5 mmol/L (ref 3.5–5.1)
Sodium: 141 mmol/L (ref 135–145)

## 2020-04-22 NOTE — Progress Notes (Signed)
Urology Inpatient Progress Note  Subjective: No concerns.  Denies pain. Hemoglobin down today, 9.0.  Creatinine down today, 3.64. Right nephrostomy tube in place draining clear, yellow urine.  Left nephrostomy tube in place draining pink urine. She reports she continues to void occasionally.  She states her urine is variable in color between yellow, pink, and occasionally light red.  Anti-infectives: Anti-infectives (From admission, onward)   Start     Dose/Rate Route Frequency Ordered Stop   04/15/20 1630  ceFAZolin (ANCEF) IVPB 2g/100 mL premix        2 g 200 mL/hr over 30 Minutes Intravenous  Once 04/15/20 1534 04/15/20 1514   04/13/20 1400  cefTRIAXone (ROCEPHIN) 1 g in sodium chloride 0.9 % 100 mL IVPB  Status:  Discontinued        1 g 200 mL/hr over 30 Minutes Intravenous Every 24 hours 04/13/20 1328 04/14/20 1847      Current Facility-Administered Medications  Medication Dose Route Frequency Provider Last Rate Last Admin  . 0.9 %  sodium chloride infusion   Intravenous Continuous Loletha Grayer, MD 50 mL/hr at 04/22/20 1434 New Bag at 04/22/20 1434  . acetaminophen (TYLENOL) tablet 650 mg  650 mg Oral Q6H PRN Mansy, Jan A, MD   650 mg at 04/21/20 2007   Or  . acetaminophen (TYLENOL) suppository 650 mg  650 mg Rectal Q6H PRN Mansy, Jan A, MD      . amLODipine (NORVASC) tablet 2.5 mg  2.5 mg Oral Daily Loletha Grayer, MD   2.5 mg at 04/22/20 0817  . bisacodyl (DULCOLAX) suppository 10 mg  10 mg Rectal Daily PRN Loletha Grayer, MD      . Chlorhexidine Gluconate Cloth 2 % PADS 6 each  6 each Topical Daily Loletha Grayer, MD   6 each at 04/22/20 [phone]  . citalopram (CELEXA) tablet 20 mg  20 mg Oral Daily Loletha Grayer, MD   20 mg at 04/22/20 0817  . famotidine (PEPCID) tablet 10 mg  10 mg Oral QHS Loletha Grayer, MD   10 mg at 04/21/20 2008  . hydrALAZINE (APRESOLINE) tablet 50 mg  50 mg Oral TID Loletha Grayer, MD   50 mg at 04/22/20 0818  . labetalol (NORMODYNE)  injection 20 mg  20 mg Intravenous Q3H PRN Mansy, Jan A, MD      . latanoprost (XALATAN) 0.005 % ophthalmic solution 1 drop  1 drop Both Eyes QHS Loletha Grayer, MD   1 drop at 04/22/20 0219  . lidocaine (LIDODERM) 5 % 1 patch  1 patch Transdermal Q24H Loletha Grayer, MD   1 patch at 04/21/20 1626  . magnesium hydroxide (MILK OF MAGNESIA) suspension 30 mL  30 mL Oral Daily PRN Mansy, Jan A, MD      . metoprolol succinate (TOPROL-XL) 24 hr tablet 25 mg  25 mg Oral Daily Loletha Grayer, MD   25 mg at 04/22/20 0817  . metoprolol tartrate (LOPRESSOR) injection 2.5 mg  2.5 mg Intravenous Q6H PRN Mansy, Jan A, MD   2.5 mg at 04/13/20 2349  . ondansetron (ZOFRAN) tablet 4 mg  4 mg Oral Q6H PRN Mansy, Jan A, MD       Or  . ondansetron [facility]) injection 4 mg  4 mg Intravenous Q6H PRN Mansy, Jan A, MD   4 mg at 04/15/20 1026  . pantoprazole (PROTONIX) EC tablet 40 mg  40 mg Oral BID Loletha Grayer, MD   40 mg at 04/22/20 0818  . pneumococcal 23 valent vaccine (  PNEUMOVAX-23) injection 0.5 mL  0.5 mL Intramuscular Tomorrow-1000 Wieting, Richard, MD      . polyethylene glycol (MIRALAX / GLYCOLAX) packet 17 g  17 g Oral Daily Loletha Grayer, MD   17 g at 04/21/20 0951  . polyvinyl alcohol (LIQUIFILM TEARS) 1.4 % ophthalmic solution 1-2 drop  1-2 drop Both Eyes BID PRN Wieting, Richard, MD      . sodium chloride flush (NS) 0.9 % injection 5 mL  5 mL Intracatheter Q12H Aletta Edouard, MD   5 mL at 04/22/20 0829  . timolol (TIMOPTIC) 0.5 % ophthalmic solution 1 drop  1 drop Both Eyes q AM Loletha Grayer, MD   1 drop at 04/22/20 0818  . traZODone (DESYREL) tablet 100 mg  100 mg Oral QHS Loletha Grayer, MD   100 mg at 04/21/20 2007  . vitamin B-12 (CYANOCOBALAMIN) tablet 250 mcg  250 mcg Oral Daily Loletha Grayer, MD   250 mcg at 04/22/20 0817   Objective: Vital signs in last 24 hours: Temp:  [98.3 F (36.8 C)-98.9 F (37.2 C)] 98.5 F (36.9 C) (08/27 1251) Pulse Rate:  [67-82] 79 (08/27  1251) Resp:  [17-20] 19 (08/27 1251) BP: (113-157)/(75-86) 153/79 (08/27 1251) SpO2:  [99 %-100 %] 99 % (08/27 0512)  Intake/Output from previous day: 08/26 0701 - 08/27 0700 In: 864.9 [P.O.:360; I.V.:494.9] Out: 825 [Urine:825] Intake/Output this shift: Total I/O In: -  Out: 750 [Urine:750]  Physical Exam Vitals and nursing note reviewed.  Constitutional:      General: She is not in acute distress.    Appearance: She is not ill-appearing, toxic-appearing or diaphoretic.  HENT:     Head: Normocephalic and atraumatic.  Pulmonary:     Effort: Pulmonary effort is normal. No respiratory distress.  Skin:    General: Skin is warm and dry.  Psychiatric:        Mood and Affect: Mood normal.        Behavior: Behavior normal.     Lab Results:  Recent Labs    04/21/20 0552 04/22/20 0723  WBC 9.2 7.9  HGB 9.8* 9.0*  HCT 28.3* 27.8*  PLT 281 265   BMET Recent Labs    04/21/20 0552 04/22/20 0723  NA 141 141  K 3.4* 3.5  CL 107 111  CO2 20* 24  GLUCOSE 86 96  BUN 50* 40*  CREATININE 4.05* 3.64*  CALCIUM 8.8* 8.6*   Assessment & Plan: 86 year old female with a recent history of muscle invasive bladder cancer with tumor obstruction of the bilateral UOs s/p bilateral nephrostomy tube placement.  She has had intermittent hematuria with slowly dropping hemoglobin.  On physical exam today, it does not appear that she is bleeding sufficiently in her urine to explain her recent drop in hemoglobin.  That said, it is reasonable to assume that she will continue to have intermittent gross hematuria with the extent of her known bladder malignancy.  May consider continuing to hold Xarelto pending cardiology approval to decrease rebleeding risk.  Debroah Loop, PA-C 04/22/2020

## 2020-04-22 NOTE — Progress Notes (Signed)
PROGRESS NOTE    Christy Mccann  EHU:[id number] DOB: [birth year] DOA: 04/13/2020 PCP: [facility]   Brief Narrative:  Christy Mccann  is a 86 y.o. slim Caucasian female with a known history of atrial fibrillation, depression, hypertension and hypothyroidism, who presented to the emergency room with acute onset of hematuria which has been intermittent over the last week.  The patient has been on Eliquis for several months for her atrial fibrillation.  She has been having occasional mild fatigue and tiredness.  She denies any abdominal or flank pain.  No fever or chills.  She has been having headache without dizziness or blurred vision.  No chest pain or dyspnea or cough or wheezing or hemoptysis.  No other bleeding diathesis. Admitted with hematuria. Found to have obstructive acute kidney injury secondary to cancerous bladder mass. Has bilateral nephrostomy tubes. Awaiting creatinine to come down a little bit more prior to disposition. Will go out to rehab. Follows with the pace program. Will need outpatient oncology evaluation.  Assessment & Plan:   Active Problems:   Acute kidney injury (AKI) with acute tubular necrosis (ATN) (HCC)   Hydronephrosis   Acute on chronic blood loss anemia   Acute kidney injury ([place])   Essential hypertension   AF (paroxysmal atrial fibrillation) (HCC)   Bladder mass   Hyperkalemia   Acquired thrombophilia ([place])   Malignant neoplasm of urinary bladder (HCC)   Goals of care, counseling/discussion   DNR (do not resuscitate)   Palliative care by specialist   1. Acute kidney injury secondary to obstruction with bilateral hydronephrosis:  Nephrostomy tubes placed by interventional radiology on 04/15/2020.  Creatinine peaked at 6.69.  Today's creatinine at 3.69.  Was hoping for better drop down in creatinine .  Will restart IV fluids 50 cc/h.  Recheck creatinine tomorrow.  2. 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  The patient understood that she has a cancerous process at this time.  Son was concerned about her mental status,  MRI of the brain negative without contrast for metastases.  Spoke with Dr. Bernardo Heater urology.  Not a surgical candidate but may be a candidate for radiation and recommended outpatient oncology evaluation.  Overall prognosis is poor.  Patient at this point is a full code.  Follow-up with the pace program for further goals of care conversations as outpatient.  3. Acute blood loss anemia with hematuria.  Xarelto on hold from admission.  Transfused 1 unit of packed red blood cells with good response hemoglobin up to 10.2 today. Urine is still pinkish, urology re-consulted.  4. Paroxysmal atrial fibrillation  HR controlled,  on low-dose Toprol.  5. Essential hypertension.  Continue lower dose Norvasc, hydralazine and Toprol.  6. Constipation on MiraLAX.  7. Cognitive issues.  MRI of the brain without contrast negative for metastases.  Did show small vessel ischemic changes.  8. Large hiatal hernia on PPI 9. Acquired thrombophilia secondary to paroxysmal atrial fibrillation.  Anticoagulation contraindicated with bleeding.    DVT prophylaxis: SCDS Code Status: Full code Family Communication: spoke with son on phone. Disposition Plan:  Dispo: The patient is from: Home  Anticipated d/c is to: Rehab  Anticipated d/c date is: Depending on tomorrow's creatinine will determine on whether or not patient can go out to rehab tomorrow or the following day but am hoping soon.  Transitional care team has bed offers.  Patient currently being treated for acute kidney injury with obstruction has nephrostomy  tubes.  Watching creatinine starting to trend better.  Consultants:   Urology   Procedures:    Antimicrobials:  Anti-infectives (From admission, onward)   Start     Dose/Rate Route Frequency Ordered Stop   04/15/20 1630   ceFAZolin (ANCEF) IVPB 2g/100 mL premix        2 g 200 mL/hr over 30 Minutes Intravenous  Once 04/15/20 1534 04/15/20 1514   04/13/20 1400  cefTRIAXone (ROCEPHIN) 1 g in sodium chloride 0.9 % 100 mL IVPB  Status:  Discontinued        1 g 200 mL/hr over 30 Minutes Intravenous Every 24 hours 04/13/20 1328 04/14/20 1847     Subjective: Patient was seen and examined at bedside.  Overnight events noted.  She is alert,  was able to understand about her condition.   She does have waxing and waning in her mental status.  She continues to have pinkish urine.  Urology consulted.  Objective: Vitals:   04/21/20 2004 04/22/20 0512 04/22/20 0838 04/22/20 1251  BP: (!) 157/78 (!) 150/85 130/75 (!) 153/79  Pulse: 82 79 67 79  Resp: 20 20 19 19   Temp: 98.9 F (37.2 C) 98.5 F (36.9 C) 98.3 F (36.8 C) 98.5 F (36.9 C)  TempSrc: Oral Oral  Oral  SpO2: 99% 99%    Weight:      Height:        Intake/Output Summary (Last 24 hours) at 04/22/2020 1549 Last data filed at 04/22/2020 1419 Gross per 24 hour  Intake 864.9 ml  Output 1575 ml  Net -710.1 ml   Filed Weights   04/14/20 2309 04/18/20 0407 04/19/20 0426  Weight: 50.8 kg 50.1 kg 49.9 kg    Examination:  General exam: Appears calm and comfortable  Respiratory system: Clear to auscultation. Respiratory effort normal. Cardiovascular system: S1 & S2 heard, RRR. No JVD, murmurs, rubs, gallops or clicks. No pedal edema. Gastrointestinal system: Abdomen is nondistended, soft and nontender. No organomegaly or masses felt. Normal bowel sounds heard. Central nervous system: Alert and oriented. No focal neurological deficits. BACK: Bilateral nephrostomy tubes noted. Extremities: Symmetric 5 x 5 power. Skin: No rashes, lesions or ulcers Psychiatry: Judgement and insight appear normal. Mood & affect appropriate.     Data Reviewed: I have personally reviewed following labs and imaging studies  CBC: Recent Labs  Lab 04/17/20 0605  04/18/20 0939 04/19/20 0541 04/21/20 0552 04/22/20 0723  WBC 11.4*  --   --  9.2 7.9  HGB 8.0* 7.6* 10.2* 9.8* 9.0*  HCT 25.1*  --   --  28.3* 27.8*  MCV 106.4*  --   --  95.9 100.0  PLT 215  --   --  281 947   Basic Metabolic Panel: Recent Labs  Lab 04/18/20 0939 04/19/20 0541 04/20/20 0451 04/21/20 0552 04/22/20 0723  NA 143 141 140 141 141  K 3.2* 3.6 3.3* 3.4* 3.5  CL 110 106 105 107 111  CO2 21* 21* 21* 20* 24  GLUCOSE 96 97 96 86 96  BUN 51* 55* 56* 50* 40*  CREATININE 5.65* 5.54* 4.87* 4.05* 3.64*  CALCIUM 8.4* 9.0 8.6* 8.8* 8.6*  MG  --  1.9  --  1.9  --   PHOS  --   --   --  3.7  --    GFR: Estimated Creatinine Clearance: 7.8 mL/min (A) (by C-G formula based on SCr of 3.64 mg/dL (H)). Liver Function Tests: No results for input(s): AST, ALT, ALKPHOS, BILITOT, PROT,  ALBUMIN in the last 168 hours. No results for input(s): LIPASE, AMYLASE in the last 168 hours. No results for input(s): AMMONIA in the last 168 hours. Coagulation Profile: No results for input(s): INR, PROTIME in the last 168 hours. Cardiac Enzymes: No results for input(s): CKTOTAL, CKMB, CKMBINDEX, TROPONINI in the last 168 hours. BNP (last 3 results) No results for input(s): PROBNP in the last 8760 hours. HbA1C: No results for input(s): HGBA1C in the last 72 hours. CBG: No results for input(s): GLUCAP in the last 168 hours. Lipid Profile: No results for input(s): CHOL, HDL, LDLCALC, TRIG, CHOLHDL, LDLDIRECT in the last 72 hours. Thyroid Function Tests: No results for input(s): TSH, T4TOTAL, FREET4, T3FREE, THYROIDAB in the last 72 hours. Anemia Panel: No results for input(s): VITAMINB12, FOLATE, FERRITIN, TIBC, IRON, RETICCTPCT in the last 72 hours. Sepsis Labs: No results for input(s): PROCALCITON, LATICACIDVEN in the last 168 hours.  Recent Results (from the past 240 hour(s))  SARS Coronavirus 2 by RT PCR (hospital order, performed in [facility] hospital lab) Nasopharyngeal  Nasopharyngeal Swab     Status: None   Collection Time: 04/13/20  2:23 AM   Specimen: Nasopharyngeal Swab  Result Value Ref Range Status   SARS Coronavirus 2 NEGATIVE NEGATIVE Final    Comment: (NOTE) SARS-CoV-2 target nucleic acids are NOT DETECTED.  The SARS-CoV-2 RNA is generally detectable in upper and lower respiratory specimens during the acute phase of infection. The lowest concentration of SARS-CoV-2 viral copies this assay can detect is 250 copies / mL. A negative result does not preclude SARS-CoV-2 infection and should not be used as the sole basis for treatment or other patient management decisions.  A negative result may occur with improper specimen collection / handling, submission of specimen other than nasopharyngeal swab, presence of viral mutation(s) within the areas targeted by this assay, and inadequate number of viral copies (<250 copies / mL). A negative result must be combined with clinical observations, patient history, and epidemiological information.  Fact Sheet for Patients:   StrictlyIdeas.no  Fact Sheet for Healthcare Providers: BankingDealers.co.za  This test is not yet approved or  cleared by the [place] FDA and has been authorized for detection and/or diagnosis of SARS-CoV-2 by FDA under an Emergency Use Authorization (EUA).  This EUA will remain in effect (meaning this test can be used) for the duration of the COVID-19 declaration under Section 564(b)(1) of the Act, 21 U.S.C. section 360bbb-3(b)(1), unless the authorization is terminated or revoked sooner.  Performed at [facility], [address], [place], [place] [address]   Urine culture     Status: Abnormal   Collection Time: 04/13/20  2:23 AM   Specimen: Urine, Random  Result Value Ref Range Status   Specimen Description   Final    URINE, RANDOM Performed at [facility], [address], [place], [place] [address]     Special Requests   Final    NONE Performed at [facility], [address] [place], [place] [address]    Culture (A)  Final    20,000 COLONIES/mL MULTIPLE SPECIES PRESENT, SUGGEST RECOLLECTION   Report Status 04/14/2020 FINAL  Final     Radiology Studies: No results found.   Scheduled Meds: . amLODipine  2.5 mg Oral Daily  . Chlorhexidine Gluconate Cloth  6 each Topical Daily  . citalopram  20 mg Oral Daily  . famotidine  10 mg Oral QHS  . hydrALAZINE  50 mg Oral TID  . latanoprost  1 drop Both Eyes QHS  . lidocaine  1 patch Transdermal Q24H  . metoprolol succinate  25 mg Oral Daily  . pantoprazole  40 mg Oral BID  . pneumococcal 23 valent vaccine  0.5 mL Intramuscular Tomorrow-1000  . polyethylene glycol  17 g Oral Daily  . sodium chloride flush  5 mL Intracatheter Q12H  . timolol  1 drop Both Eyes q AM  . traZODone  100 mg Oral QHS  . vitamin B-12  250 mcg Oral Daily   Continuous Infusions: . sodium chloride 50 mL/hr at 04/22/20 1434     LOS: 9 days    Time spent: Cochran, MD [facility]   If 7PM-7AM, please contact night-coverage

## 2020-04-22 NOTE — Care Management Important Message (Signed)
Important Message  Patient Details  Name: TAUSHA MILHOAN MRN: [id number] Date of Birth: 07-16-1931   Medicare Important Message Given:  Yes     Dannette Barbara 04/22/2020, 2:00 PM

## 2020-04-23 LAB — BASIC METABOLIC PANEL
Anion gap: 10 (ref 5–15)
BUN: 36 mg/dL — ABNORMAL HIGH (ref 8–23)
CO2: 19 mmol/L — ABNORMAL LOW (ref 22–32)
Calcium: 8.3 mg/dL — ABNORMAL LOW (ref 8.9–10.3)
Chloride: 110 mmol/L (ref 98–111)
Creatinine, Ser: 3.34 mg/dL — ABNORMAL HIGH (ref 0.44–1.00)
GFR calc Af Amer: 14 mL/min — ABNORMAL LOW (ref >60)
GFR calc non Af Amer: 12 mL/min — ABNORMAL LOW (ref >60)
Glucose, Bld: 91 mg/dL (ref 70–99)
Potassium: 3.5 mmol/L (ref 3.5–5.1)
Sodium: 139 mmol/L (ref 135–145)

## 2020-04-23 LAB — CBC
HCT: 29.8 % — ABNORMAL LOW (ref 36.0–46.0)
Hemoglobin: 9.3 g/dL — ABNORMAL LOW (ref 12.0–15.0)
MCH: 31.6 pg (ref 26.0–34.0)
MCHC: 31.2 g/dL (ref 30.0–36.0)
MCV: 101.4 fL — ABNORMAL HIGH (ref 80.0–100.0)
Platelets: 284 10*3/uL (ref 150–400)
RBC: 2.94 MIL/uL — ABNORMAL LOW (ref 3.87–5.11)
RDW: 14.8 % (ref 11.5–15.5)
WBC: 7.8 10*3/uL (ref 4.0–10.5)
nRBC: 0 % (ref 0.0–0.2)

## 2020-04-23 LAB — PHOSPHORUS: Phosphorus: 3.1 mg/dL (ref 2.5–4.6)

## 2020-04-23 LAB — MAGNESIUM: Magnesium: 1.7 mg/dL (ref 1.7–2.4)

## 2020-04-23 NOTE — Progress Notes (Signed)
PROGRESS NOTE    Christy Mccann  FGH:[id number] DOB: Mar 05, 1931 DOA: 04/13/2020 PCP: [place]   Brief Narrative:  Christy Mccann  is a 86 y.o. slim Caucasian female with a known history of atrial fibrillation, depression, hypertension and hypothyroidism, who presented to the emergency room with acute onset of hematuria which has been intermittent over the last week.  The patient has been on Eliquis for several months for her atrial fibrillation.  She has been having occasional mild fatigue and tiredness.  She denies any abdominal or flank pain.  No fever or chills.  She has been having headache without dizziness or blurred vision.  No chest pain or dyspnea or cough or wheezing or hemoptysis.  No other bleeding diathesis. Admitted with hematuria. Found to have obstructive acute kidney injury secondary to cancerous bladder mass. Has bilateral nephrostomy tubes. Awaiting creatinine to come down a little bit more prior to disposition. Will go out to rehab. Follows with the pace program. Will need outpatient oncology evaluation.  Assessment & Plan:   Active Problems:   Acute kidney injury (AKI) with acute tubular necrosis (ATN) (HCC)   Hydronephrosis   Acute on chronic blood loss anemia   Acute kidney injury ([place])   Essential hypertension   AF (paroxysmal atrial fibrillation) (HCC)   Bladder mass   Hyperkalemia   Acquired thrombophilia ([place])   Malignant neoplasm of urinary bladder (HCC)   Goals of care, counseling/discussion   DNR (do not resuscitate)   Palliative care by specialist   1. Acute kidney injury secondary to obstruction with bilateral hydronephrosis:  Nephrostomy tubes placed by interventional radiology on 04/15/2020.  Creatinine peaked at 6.69.  Today's creatinine at 3.39.  Was hoping for better drop down in creatinine .  Will restart IV fluids 50 cc/h.  Recheck creatinine tomorrow.  2. 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  The patient understood that she has a cancerous process at this time.  Son was concerned about her mental status,  MRI of the brain negative without contrast for metastases.  Spoke with Dr. Bernardo Heater urology.  Not a surgical candidate but may be a candidate for radiation and recommended outpatient oncology evaluation.  Overall prognosis is poor.  Patient at this point is a full code.  Follow-up with the pace program for further goals of care conversations as outpatient.  3. Acute blood loss anemia with hematuria.  Xarelto on hold from admission.  Transfused 1 unit of packed red blood cells with good response,  hemoglobin up to 10.2 today. Urine is still pinkish, urology re-consulted.  4. Paroxysmal atrial fibrillation  HR controlled,  on low-dose Toprol.  5. Essential hypertension.  Continue lower dose Norvasc, hydralazine and Toprol.  6. Constipation on MiraLAX.  7. Cognitive issues.  MRI of the brain without contrast negative for metastases.  Did show small vessel ischemic changes.  8. Large hiatal hernia on PPI 9. Acquired thrombophilia secondary to paroxysmal atrial fibrillation.  Anticoagulation contraindicated with bleeding.    DVT prophylaxis: SCDS Code Status: Full code Family Communication: spoke with son on phone. Disposition Plan:  Dispo: The patient is from: Home  Anticipated d/c is to: Rehab  Anticipated d/c date is: Depending on tomorrow's creatinine will determine on whether or not patient can go out to rehab tomorrow or the following day but am hoping soon.  Transitional care team has bed offers.  Patient currently being treated for acute kidney injury with obstruction has  nephrostomy tubes.  Watching creatinine starting to trend better.  Consultants:   Urology   Procedures:    Antimicrobials:  Anti-infectives (From admission, onward)   Start     Dose/Rate Route Frequency Ordered Stop   04/15/20 1630   ceFAZolin (ANCEF) IVPB 2g/100 mL premix        2 g 200 mL/hr over 30 Minutes Intravenous  Once 04/15/20 1534 04/15/20 1514   04/13/20 1400  cefTRIAXone (ROCEPHIN) 1 g in sodium chloride 0.9 % 100 mL IVPB  Status:  Discontinued        1 g 200 mL/hr over 30 Minutes Intravenous Every 24 hours 04/13/20 1328 04/14/20 1847     Subjective: Patient was seen and examined at bedside.  Overnight events noted.  She is alert, Awake, oriented x 2 She does have waxing and waning in her mental status.  She continues to have pinkish urine.  Urology consulted.  Objective: Vitals:   04/22/20 2013 04/23/20 0551 04/23/20 0734 04/23/20 1125  BP: (!) 146/75 (!) 155/65 (!) 161/84 (!) 152/78  Pulse: 94 85 80 76  Resp: 20 20 19 19   Temp: 98.6 F (37 C) 98.4 F (36.9 C) 97.8 F (36.6 C) 97.9 F (36.6 C)  TempSrc: Oral Oral    SpO2: 100% 98% 98% 99%  Weight:      Height:        Intake/Output Summary (Last 24 hours) at 04/23/2020 1406 Last data filed at 04/23/2020 1220 Gross per 24 hour  Intake 850 ml  Output 1950 ml  Net -1100 ml   Filed Weights   04/14/20 2309 04/18/20 0407 04/19/20 0426  Weight: 50.8 kg 50.1 kg 49.9 kg    Examination:  General exam: Appears calm and comfortable  Respiratory system: Clear to auscultation. Respiratory effort normal. Cardiovascular system: S1 & S2 heard, RRR. No JVD, murmurs, rubs, gallops or clicks. No pedal edema. Gastrointestinal system: Abdomen is nondistended, soft and nontender. No organomegaly or masses felt. Normal bowel sounds heard. Central nervous system: Alert and oriented. No focal neurological deficits. BACK: Bilateral nephrostomy tubes noted. Extremities: Symmetric 5 x 5 power. Skin: No rashes, lesions or ulcers Psychiatry: Judgement and insight appear normal. Mood & affect appropriate.     Data Reviewed: I have personally reviewed following labs and imaging studies  CBC: Recent Labs  Lab 04/17/20 0605 04/17/20 0605 04/18/20 0939  04/19/20 0541 04/21/20 0552 04/22/20 0723 04/23/20 0631  WBC 11.4*  --   --   --  9.2 7.9 7.8  HGB 8.0*   < > 7.6* 10.2* 9.8* 9.0* 9.3*  HCT 25.1*  --   --   --  28.3* 27.8* 29.8*  MCV 106.4*  --   --   --  95.9 100.0 101.4*  PLT 215  --   --   --  281 265 284   < > = values in this interval not displayed.   Basic Metabolic Panel: Recent Labs  Lab 04/19/20 0541 04/20/20 0451 04/21/20 0552 04/22/20 0723 04/23/20 0631  NA 141 140 141 141 139  K 3.6 3.3* 3.4* 3.5 3.5  CL 106 105 107 111 110  CO2 21* 21* 20* 24 19*  GLUCOSE 97 96 86 96 91  BUN 55* 56* 50* 40* 36*  CREATININE 5.54* 4.87* 4.05* 3.64* 3.34*  CALCIUM 9.0 8.6* 8.8* 8.6* 8.3*  MG 1.9  --  1.9  --  1.7  PHOS  --   --  3.7  --  3.1   GFR:  Estimated Creatinine Clearance: 8.5 mL/min (A) (by C-G formula based on SCr of 3.34 mg/dL (H)). Liver Function Tests: No results for input(s): AST, ALT, ALKPHOS, BILITOT, PROT, ALBUMIN in the last 168 hours. No results for input(s): LIPASE, AMYLASE in the last 168 hours. No results for input(s): AMMONIA in the last 168 hours. Coagulation Profile: No results for input(s): INR, PROTIME in the last 168 hours. Cardiac Enzymes: No results for input(s): CKTOTAL, CKMB, CKMBINDEX, TROPONINI in the last 168 hours. BNP (last 3 results) No results for input(s): PROBNP in the last 8760 hours. HbA1C: No results for input(s): HGBA1C in the last 72 hours. CBG: No results for input(s): GLUCAP in the last 168 hours. Lipid Profile: No results for input(s): CHOL, HDL, LDLCALC, TRIG, CHOLHDL, LDLDIRECT in the last 72 hours. Thyroid Function Tests: No results for input(s): TSH, T4TOTAL, FREET4, T3FREE, THYROIDAB in the last 72 hours. Anemia Panel: No results for input(s): VITAMINB12, FOLATE, FERRITIN, TIBC, IRON, RETICCTPCT in the last 72 hours. Sepsis Labs: No results for input(s): PROCALCITON, LATICACIDVEN in the last 168 hours.  No results found for this or any previous visit (from the  past 240 hour(s)).   Radiology Studies: No results found.   Scheduled Meds: . amLODipine  2.5 mg Oral Daily  . Chlorhexidine Gluconate Cloth  6 each Topical Daily  . citalopram  20 mg Oral Daily  . famotidine  10 mg Oral QHS  . hydrALAZINE  50 mg Oral TID  . latanoprost  1 drop Both Eyes QHS  . lidocaine  1 patch Transdermal Q24H  . metoprolol succinate  25 mg Oral Daily  . pantoprazole  40 mg Oral BID  . pneumococcal 23 valent vaccine  0.5 mL Intramuscular Tomorrow-1000  . polyethylene glycol  17 g Oral Daily  . sodium chloride flush  5 mL Intracatheter Q12H  . timolol  1 drop Both Eyes q AM  . traZODone  100 mg Oral QHS  . vitamin B-12  250 mcg Oral Daily   Continuous Infusions: . sodium chloride 50 mL/hr at 04/23/20 0831     LOS: 10 days    Time spent: [place], MD [facility]   If 7PM-7AM, please contact night-coverage

## 2020-04-24 LAB — HEMOGLOBIN AND HEMATOCRIT, BLOOD
HCT: 27.9 % — ABNORMAL LOW (ref 36.0–46.0)
Hemoglobin: 8.7 g/dL — ABNORMAL LOW (ref 12.0–15.0)

## 2020-04-24 LAB — BASIC METABOLIC PANEL
Anion gap: 10 (ref 5–15)
BUN: 34 mg/dL — ABNORMAL HIGH (ref 8–23)
CO2: 23 mmol/L (ref 22–32)
Calcium: 8.3 mg/dL — ABNORMAL LOW (ref 8.9–10.3)
Chloride: 106 mmol/L (ref 98–111)
Creatinine, Ser: 3.17 mg/dL — ABNORMAL HIGH (ref 0.44–1.00)
GFR calc Af Amer: 15 mL/min — ABNORMAL LOW (ref >60)
GFR calc non Af Amer: 13 mL/min — ABNORMAL LOW (ref >60)
Glucose, Bld: 84 mg/dL (ref 70–99)
Potassium: 3.5 mmol/L (ref 3.5–5.1)
Sodium: 139 mmol/L (ref 135–145)

## 2020-04-24 LAB — MAGNESIUM: Magnesium: 1.6 mg/dL — ABNORMAL LOW (ref 1.7–2.4)

## 2020-04-24 LAB — PHOSPHORUS: Phosphorus: 3.4 mg/dL (ref 2.5–4.6)

## 2020-04-24 MED ORDER — MAGNESIUM SULFATE 2 GM/50ML IV SOLN
2.0000 g | Freq: Once | INTRAVENOUS | Status: AC
  Administered 2020-04-24: 2 g via INTRAVENOUS
  Filled 2020-04-24: qty 50

## 2020-04-24 NOTE — Progress Notes (Signed)
PROGRESS NOTE    Christy Mccann  BJY:[id number] DOB: 04/08/1931 DOA: 04/13/2020 PCP: [facility]   Brief Narrative:  Christy Mccann  is a 86 y.o. slim Caucasian female with a known history of atrial fibrillation, depression, hypertension and hypothyroidism, who presented to the emergency room with acute onset of hematuria which has been intermittent over the last week.  The patient has been on Eliquis for several months for her atrial fibrillation.  She has been having occasional mild fatigue and tiredness.  She denies any abdominal or flank pain.  No fever or chills.  She has been having headache without dizziness or blurred vision.  No chest pain or dyspnea or cough or wheezing or hemoptysis.  No other bleeding diathesis. Admitted with hematuria. Found to have obstructive acute kidney injury secondary to cancerous bladder mass. Has bilateral nephrostomy tubes. Awaiting creatinine to come down  prior to disposition. Will go out to rehab. Follows with [facility]. Will need outpatient oncology evaluation.  Assessment & Plan:   Active Problems:   Acute kidney injury (AKI) with acute tubular necrosis (ATN) (HCC)   Hydronephrosis   Acute on chronic blood loss anemia   Acute kidney injury ([place])   Essential hypertension   AF (paroxysmal atrial fibrillation) (HCC)   Bladder mass   Hyperkalemia   Acquired thrombophilia ([place])   Malignant neoplasm of urinary bladder (HCC)   Goals of care, counseling/discussion   DNR (do not resuscitate)   Palliative care by specialist   1. Acute kidney injury secondary to obstruction with bilateral hydronephrosis:  Nephrostomy tubes placed by interventional radiology on 04/15/2020.  Creatinine peaked at 6.69.  Today's creatinine at 3.17.  hoping for better drop down in creatinine .  Will restart IV fluids 50 cc/h.  Recheck creatinine tomorrow.  2. 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  The patient understood that she has a cancerous process at this time.  Son was concerned about her mental status,  MRI of the brain negative without contrast for metastases.  Spoke with Dr. Bernardo Heater urology.  Not a surgical candidate but may be a candidate for radiation and recommended outpatient oncology evaluation.  Overall prognosis is poor.  Patient at this point is a full code.  Follow-up with the [facility] for further goals of care conversations as outpatient.  3. Acute blood loss anemia with hematuria.  Xarelto on hold from admission.  Transfused 1 unit of packed red blood cells with good response,  hemoglobin up to 9.3 today. Urine is still pinkish, urology re-consulted.  4. Paroxysmal atrial fibrillation  HR controlled,  on low-dose Toprol.  5. Essential hypertension.  Continue lower dose Norvasc, hydralazine and Toprol.  6. Constipation on MiraLAX.  7. Cognitive issues.  MRI of the brain without contrast negative for metastases.  Did show small vessel ischemic changes.  8. Large hiatal hernia on PPI 9. Acquired thrombophilia secondary to paroxysmal atrial fibrillation.  Anticoagulation contraindicated with bleeding.    DVT prophylaxis: SCDS Code Status: Full code Family Communication: spoke with son on phone. Disposition Plan:  Dispo: The patient is from: Home  Anticipated d/c is to: Rehab  Anticipated d/c date is: Depending on tomorrow's creatinine will determine on whether or not patient can go out to rehab tomorrow or the following day   Transitional care team has bed offers.  Patient currently being treated for acute kidney injury with obstruction has nephrostomy tubes.  Watching creatinine starting to  trend better.  Consultants:   Urology   Procedures:    Antimicrobials:  Anti-infectives (From admission, onward)   Start     Dose/Rate Route Frequency Ordered Stop   04/15/20 1630  ceFAZolin (ANCEF) IVPB 2g/100 mL premix         2 g 200 mL/hr over 30 Minutes Intravenous  Once 04/15/20 1534 04/15/20 1514   04/13/20 1400  cefTRIAXone (ROCEPHIN) 1 g in sodium chloride 0.9 % 100 mL IVPB  Status:  Discontinued        1 g 200 mL/hr over 30 Minutes Intravenous Every 24 hours 04/13/20 1328 04/14/20 1847     Subjective: Patient was seen and examined at bedside.  Overnight events noted.  She is alert, Awake, oriented x 2 She does have waxing and waning in her mental status.  She reports feeling better.  Objective: Vitals:   04/23/20 2109 04/24/20 0359 04/24/20 0837 04/24/20 1156  BP: (!) 145/82 (!) 156/85 (!) 143/86 100/89  Pulse: 77 78 84 69  Resp: 15 15 18 16   Temp: 99.1 F (37.3 C) 98.6 F (37 C) 99.4 F (37.4 C) 99 F (37.2 C)  TempSrc: Oral Oral Oral Oral  SpO2: 98% 98% 96% 98%  Weight:      Height:        Intake/Output Summary (Last 24 hours) at 04/24/2020 1354 Last data filed at 04/24/2020 1346 Gross per 24 hour  Intake 1487.44 ml  Output 1090 ml  Net 397.44 ml   Filed Weights   04/14/20 2309 04/18/20 0407 04/19/20 0426  Weight: 50.8 kg 50.1 kg 49.9 kg    Examination:  General exam: Appears calm and comfortable  Respiratory system: Clear to auscultation. Respiratory effort normal. Cardiovascular system: S1 & S2 heard, RRR. No JVD, murmurs, rubs, gallops or clicks. No pedal edema. Gastrointestinal system: Abdomen is nondistended, soft and nontender. No organomegaly or masses felt. Normal bowel sounds heard. Central nervous system: Alert and oriented. No focal neurological deficits. BACK: Bilateral nephrostomy tubes noted. Extremities: Symmetric 5 x 5 power. Skin: No rashes, lesions or ulcers Psychiatry: Judgement and insight appear normal. Mood & affect appropriate.     Data Reviewed: I have personally reviewed following labs and imaging studies  CBC: Recent Labs  Lab 04/19/20 0541 04/21/20 0552 04/22/20 0723 04/23/20 0631 04/24/20 0543  WBC  --  9.2 7.9 7.8  --   HGB 10.2*  9.8* 9.0* 9.3* 8.7*  HCT  --  28.3* 27.8* 29.8* 27.9*  MCV  --  95.9 100.0 101.4*  --   PLT  --  281 265 284  --    Basic Metabolic Panel: Recent Labs  Lab 04/19/20 0541 04/19/20 0541 04/20/20 0451 04/21/20 0552 04/22/20 0723 04/23/20 0631 04/24/20 0543  NA 141   < > 140 141 141 139 139  K 3.6   < > 3.3* 3.4* 3.5 3.5 3.5  CL 106   < > 105 107 111 110 106  CO2 21*   < > 21* 20* 24 19* 23  GLUCOSE 97   < > 96 86 96 91 84  BUN 55*   < > 56* 50* 40* 36* 34*  CREATININE 5.54*   < > 4.87* 4.05* 3.64* 3.34* 3.17*  CALCIUM 9.0   < > 8.6* 8.8* 8.6* 8.3* 8.3*  MG 1.9  --   --  1.9  --  1.7 1.6*  PHOS  --   --   --  3.7  --  3.1 3.4   < > =  values in this interval not displayed.   GFR: Estimated Creatinine Clearance: 9 mL/min (A) (by C-G formula based on SCr of 3.17 mg/dL (H)). Liver Function Tests: No results for input(s): AST, ALT, ALKPHOS, BILITOT, PROT, ALBUMIN in the last 168 hours. No results for input(s): LIPASE, AMYLASE in the last 168 hours. No results for input(s): AMMONIA in the last 168 hours. Coagulation Profile: No results for input(s): INR, PROTIME in the last 168 hours. Cardiac Enzymes: No results for input(s): CKTOTAL, CKMB, CKMBINDEX, TROPONINI in the last 168 hours. BNP (last 3 results) No results for input(s): PROBNP in the last 8760 hours. HbA1C: No results for input(s): HGBA1C in the last 72 hours. CBG: No results for input(s): GLUCAP in the last 168 hours. Lipid Profile: No results for input(s): CHOL, HDL, LDLCALC, TRIG, CHOLHDL, LDLDIRECT in the last 72 hours. Thyroid Function Tests: No results for input(s): TSH, T4TOTAL, FREET4, T3FREE, THYROIDAB in the last 72 hours. Anemia Panel: No results for input(s): VITAMINB12, FOLATE, FERRITIN, TIBC, IRON, RETICCTPCT in the last 72 hours. Sepsis Labs: No results for input(s): PROCALCITON, LATICACIDVEN in the last 168 hours.  No results found for this or any previous visit (from the past 240 hour(s)).    Radiology Studies: No results found.   Scheduled Meds: . amLODipine  2.5 mg Oral Daily  . Chlorhexidine Gluconate Cloth  6 each Topical Daily  . citalopram  20 mg Oral Daily  . famotidine  10 mg Oral QHS  . hydrALAZINE  50 mg Oral TID  . latanoprost  1 drop Both Eyes QHS  . lidocaine  1 patch Transdermal Q24H  . metoprolol succinate  25 mg Oral Daily  . pantoprazole  40 mg Oral BID  . pneumococcal 23 valent vaccine  0.5 mL Intramuscular Tomorrow-1000  . polyethylene glycol  17 g Oral Daily  . sodium chloride flush  5 mL Intracatheter Q12H  . timolol  1 drop Both Eyes q AM  . traZODone  100 mg Oral QHS  . vitamin B-12  250 mcg Oral Daily   Continuous Infusions: . sodium chloride 50 mL/hr at 04/24/20 0738     LOS: 11 days    Time spent: [place], MD [facility]   If 7PM-7AM, please contact night-coverage

## 2020-04-24 NOTE — Progress Notes (Signed)
Physical Therapy Treatment Patient Details Name: Christy Mccann MRN: [id number] DOB: 24-Feb-1931 Today's Date: 04/24/2020    History of Present Illness 86 y.o. female  with past medical history of afib, depression, HTN, and hypothyroidism admitted on 04/13/2020 with hematuria. She was found to have invasive high-grade papillary urothelial carcinoma. Also diagnosed with AKI secondary to obstruction w/ bilateral hydronephrosis. Nephrostomy tubes were placed 8/20.    PT Comments    Patient is a pleasantly confused 86 year old female who presents in bed with telesitter present upon PT arrival. Patient agreeable to participate in PT session and performed supine,seated, and standing strengthening interventions. She requires cueing for safety awareness and sequencing and became fearful in standing requiring return to seated position after two sets of standing marches with RW. Patient is very eager to perform exercises but fatigues quickly limiting duration of activity. Patient returned to bed with all needs met and alarms on. Current POC remains appropriate at this time.     Follow Up Recommendations  SNF;Supervision for mobility/OOB     Equipment Recommendations  Rolling walker with 5" wheels    Recommendations for Other Services       Precautions / Restrictions Precautions Precautions: Fall Precaution Comments: B nephrostomy tubes Restrictions Weight Bearing Restrictions: No    Mobility  Bed Mobility Overal bed mobility: Needs Assistance Bed Mobility: Supine to Sit;Sit to Supine     Supine to sit: Min assist Sit to supine: Min assist   General bed mobility comments: requires Min A for sequencing as well as line/drain management  Transfers Overall transfer level: Needs assistance Equipment used: Rolling walker (2 wheeled) Transfers: Sit to/from Stand Sit to Stand: Min assist         General transfer comment: cues for hand placement and sequencing.  Ambulation/Gait              General Gait Details: deferred due to fatigue, anxiety with mobility/standing and instability with static march   Stairs             Wheelchair Mobility    Modified Rankin (Stroke Patients Only)       Balance Overall balance assessment: Needs assistance Sitting-balance support: Feet supported Sitting balance-Leahy Scale: Good     Standing balance support: Bilateral upper extremity supported;During functional activity Standing balance-Leahy Scale: Fair Standing balance comment: requires UE support on RW for mobility                            Cognition Arousal/Alertness: Awake/alert Behavior During Therapy: [employer] for tasks assessed/performed;Anxious Overall Cognitive Status: No family/caregiver present to determine baseline cognitive functioning                                 General Comments: Patient confused but pleasant throughout session.      Exercises General Exercises - Lower Extremity Ankle Circles/Pumps: Strengthening;Both;15 reps;Supine Long Arc Quad: Strengthening;Both;10 reps;Seated Heel Slides: Strengthening;Both;10 reps;Supine Hip Flexion/Marching: Strengthening;Both;15 reps;Seated;Standing Other Exercises Other Exercises: BLE strengthening exercises: standing marches 15x each LE, 2 sets total with rest break between, seated march and LAQ x10-15, supine heel slide10-15, ankle pumps 10-15.    General Comments        Pertinent Vitals/Pain Pain Assessment: No/denies pain    Home Living                      Prior Function  PT Goals (current goals can now be found in the care plan section) Acute Rehab PT Goals Patient Stated Goal: patient is open to STR PT Goal Formulation: With patient Time For Goal Achievement: 04/30/20 Potential to Achieve Goals: Good Progress towards PT goals: Progressing toward goals    Frequency    Min 2X/week      PT Plan Current plan remains appropriate     Co-evaluation              AM-PAC PT "6 Clicks" Mobility   Outcome Measure  Help needed turning from your back to your side while in a flat bed without using bedrails?: A Little Help needed moving from lying on your back to sitting on the side of a flat bed without using bedrails?: A Little Help needed moving to and from a bed to a chair (including a wheelchair)?: A Little Help needed standing up from a chair using your arms (e.g., wheelchair or bedside chair)?: A Little Help needed to walk in hospital room?: A Lot Help needed climbing 3-5 steps with a railing? : A Lot 6 Click Score: 16    End of Session Equipment Utilized During Treatment: Gait belt Activity Tolerance: Patient limited by fatigue Patient left: in bed;with call bell/phone within reach;with bed alarm set;Other (comment) (telesitter) Nurse Communication: Mobility status PT Visit Diagnosis: Unsteadiness on feet (R26.81);Muscle weakness (generalized) (M62.81);Difficulty in walking, not elsewhere classified (R26.2)     Time: 9106-8166 PT Time Calculation (min) (ACUTE ONLY): 17 min  Charges:  $Therapeutic Exercise: 8-22 mins                    Janna Arch, PT, DPT   04/24/2020, 3:46 PM

## 2020-04-25 LAB — CBC
HCT: 27.3 % — ABNORMAL LOW (ref 36.0–46.0)
Hemoglobin: 8.8 g/dL — ABNORMAL LOW (ref 12.0–15.0)
MCH: 32.5 pg (ref 26.0–34.0)
MCHC: 32.2 g/dL (ref 30.0–36.0)
MCV: 100.7 fL — ABNORMAL HIGH (ref 80.0–100.0)
Platelets: 312 10*3/uL (ref 150–400)
RBC: 2.71 MIL/uL — ABNORMAL LOW (ref 3.87–5.11)
RDW: 14.5 % (ref 11.5–15.5)
WBC: 8.7 10*3/uL (ref 4.0–10.5)
nRBC: 0 % (ref 0.0–0.2)

## 2020-04-25 LAB — BASIC METABOLIC PANEL
Anion gap: 6 (ref 5–15)
BUN: 30 mg/dL — ABNORMAL HIGH (ref 8–23)
CO2: 25 mmol/L (ref 22–32)
Calcium: 8.4 mg/dL — ABNORMAL LOW (ref 8.9–10.3)
Chloride: 108 mmol/L (ref 98–111)
Creatinine, Ser: 2.92 mg/dL — ABNORMAL HIGH (ref 0.44–1.00)
GFR calc Af Amer: 16 mL/min — ABNORMAL LOW (ref >60)
GFR calc non Af Amer: 14 mL/min — ABNORMAL LOW (ref >60)
Glucose, Bld: 95 mg/dL (ref 70–99)
Potassium: 3.4 mmol/L — ABNORMAL LOW (ref 3.5–5.1)
Sodium: 139 mmol/L (ref 135–145)

## 2020-04-25 LAB — SARS CORONAVIRUS 2 (TAT 6-24 HRS): SARS Coronavirus 2: NEGATIVE

## 2020-04-25 LAB — MAGNESIUM: Magnesium: 2.3 mg/dL (ref 1.7–2.4)

## 2020-04-25 MED ORDER — METOPROLOL SUCCINATE ER 25 MG PO TB24: 25.0000 mg | ORAL_TABLET | Freq: Every day | ORAL | 0 refills | Status: AC

## 2020-04-25 MED ORDER — POTASSIUM CHLORIDE 20 MEQ PO PACK
40.0000 meq | PACK | Freq: Once | ORAL | Status: AC
  Administered 2020-04-25: 40 meq via ORAL
  Filled 2020-04-25: qty 2

## 2020-04-25 NOTE — Discharge Instructions (Signed)
Advised to follow up PCP in 2-3 days. Advised to follow up with Urology for bladder cancer and nephrostomy tubes, Advised PCP to check CBC and BMP . Advised to hold xarelto on hold for now.

## 2020-04-25 NOTE — Progress Notes (Signed)
Occupational Therapy Treatment Patient Details Name: Christy Mccann MRN: [id number] DOB: August 30, 1930 Today's Date: 04/25/2020    History of present illness 86 y.o. female  with past medical history of afib, depression, HTN, and hypothyroidism admitted on 04/13/2020 with hematuria. She was found to have invasive high-grade papillary urothelial carcinoma. Also diagnosed with AKI secondary to obstruction w/ bilateral hydronephrosis. Nephrostomy tubes were placed 8/20.   OT comments  Pt seen for OT tx this date to f/u re: safety with ADLs/ADL mobility. OT encourages pt to get OOB and she does endorse feeling like she might be able to use BSC, so commode positioned to pivot from EOB with RW. Pt able to perform sup to sit with SUPV. Requires MIN A with RW for SPS to BSC. Pt unable to use restroom, but wants to get cleaned up. OT facilitates pt participation in bathing with setup for UB in sitting and CGA/MIN A for UB using sit<>stand. Pt tolerates well overall. OOB time ~22 mins. Returned to bed with alarms set and call bell obtained from nursing station and given to pt as she did not appear to have one upon OT presentation. Will continue to follow. Continue to recommend SNF d/t decreased activity tolerance and safety awareness.     Follow Up Recommendations  SNF    Equipment Recommendations  Other (comment) (defer to next level of care. If pt returns home, will need [facility] and 2WW)    Recommendations for Other Services      Precautions / Restrictions Precautions Precautions: Fall Precaution Comments: B nephrostomy tubes Restrictions Weight Bearing Restrictions: No       Mobility Bed Mobility Overal bed mobility: Needs Assistance Bed Mobility: Supine to Sit;Sit to Supine     Supine to sit: Supervision Sit to supine: Supervision   General bed mobility comments: increased time, cues to sequence  Transfers Overall transfer level: Needs assistance Equipment used: Rolling walker (2 wheeled)    Sit to Stand: Min guard;Min assist         General transfer comment: increased time and cues for safety (hand placement)    Balance Overall balance assessment: Needs assistance Sitting-balance support: Feet supported Sitting balance-Leahy Scale: Good     Standing balance support: Bilateral upper extremity supported;During functional activity Standing balance-Leahy Scale: Fair Standing balance comment: UE support on RW                           ADL either performed or assessed with clinical judgement   ADL Overall ADL's : Needs assistance/impaired         Upper Body Bathing: Set up;Sitting;Cueing for sequencing   Lower Body Bathing: Min guard;Minimal assistance;Sit to/from stand;Cueing for sequencing Lower Body Bathing Details (indicate cue type and reason): MIN A and cues for thorough completion, CGA for standing balance Upper Body Dressing : Minimal assistance;Sitting   Lower Body Dressing: Minimal assistance;Cueing for safety;Cueing for sequencing;Sit to/from stand   Toilet Transfer: Min guard;BSC;RW                   Vision Baseline Vision/History: Wears glasses Wears Glasses: Reading only Patient Visual Report: No change from baseline     Perception     Praxis      Cognition Arousal/Alertness: Awake/alert Behavior During Therapy: WFL for tasks assessed/performed;Anxious Overall Cognitive Status: No family/caregiver present to determine baseline cognitive functioning  General Comments: Patient confused but pleasant throughout session. Able to follow simple one step commands.        Exercises Other Exercises Other Exercises: OT facilitates pt participation in bathing, mostly seated on BSC with sit to stand for LB with MIN A for thorough completion and MIN A for LB dressing. Pt tolerates well. OOB ~22 mins.   Shoulder Instructions       General Comments      Pertinent Vitals/ Pain        Pain Assessment: No/denies pain  Home Living                                          Prior Functioning/Environment              Frequency  Min 2X/week        Progress Toward Goals  OT Goals(current goals can now be found in the care plan section)  Progress towards OT goals: Progressing toward goals  Acute Rehab OT Goals Patient Stated Goal: pt wants to "get cleaned up and out of this place" OT Goal Formulation: With patient Time For Goal Achievement: 04/30/20 Potential to Achieve Goals: [place] Discharge plan remains appropriate    Co-evaluation                 AM-PAC OT "6 Clicks" Daily Activity     Outcome Measure   Help from another person eating meals?: None Help from another person taking care of personal grooming?: A Little Help from another person toileting, which includes using toliet, bedpan, or urinal?: A Little Help from another person bathing (including washing, rinsing, drying)?: A Little Help from another person to put on and taking off regular upper body clothing?: A Little Help from another person to put on and taking off regular lower body clothing?: A Little 6 Click Score: 19    End of Session Equipment Utilized During Treatment: Rolling walker;Gait belt  OT Visit Diagnosis: Unsteadiness on feet (R26.81);Other abnormalities of gait and mobility (R26.89);Muscle weakness (generalized) (M62.81)   Activity Tolerance Patient tolerated treatment well   Patient Left in bed;with call bell/phone within reach;with bed alarm set;Other (comment) (chair alarm set on bed as well)   Nurse Communication          Time: 1937-9024 OT Time Calculation (min): 32 min  Charges: OT General Charges $OT Visit: 1 Visit OT Treatments $Self Care/Home Management : 23-37 mins  Gerrianne Scale, MS, OTR/L ascom [phone] 04/25/20, 1:39 PM

## 2020-04-25 NOTE — Discharge Summary (Signed)
Physician Discharge Summary  TYLENA PRISK VFI:[id number] DOB: August 22, 1931 DOA: 04/13/2020  PCP: [place] date: 04/13/2020   Discharge date: 04/25/2020  Admitted From:   SNF  Disposition:  SNF  [facility])  Recommendations for Outpatient Follow-up:  1. Follow up with PCP in 1-2 days. 2. Please obtain BMP/CBC in one week 3. Advised to follow up with Urology for bladder cancer and nephrostomy tubes, 4. Advised PCP to check CBC and BMP .  Home Health: None Equipment/Devices: Bilateral Nephrostomy tubes.  Discharge Condition: stable. CODE STATUS:DNR Diet recommendation: Heart Healthy   Brief Summary / Hospital course : KatieTreois a38 y.o.slim Caucasian femalewith a known history of atrial fibrillation, depression, hypertension and hypothyroidism, who presented to the emergency room with acute onset of hematuria which has been intermittent over the last week. The patient has been on Eliquis for several months for her atrial fibrillation. She has been having occasional mild fatigue and tiredness. She denies any abdominal or flank pain. No fever or chills. She has been having headache without dizziness or blurred vision. No chest pain or dyspnea or cough or wheezing or hemoptysis. No other bleeding diathesis.  She was admitted with acute hematuria.  Urology consulted, She was started on CBI initially, Eliquis placed on hold. She isfound to have obstructive acute kidney injury secondary to cancerous bladder mass. She underwent  bilateral nephrostomy tubes for Bilateral hydronephrosis.  Her serum creatinine on admission was 6.65 which trended down with IV fluids and post nephrostomy tubes.  Her serum creatinine on discharge was 2.92.  Patient has developed delirium during hospitalization which requires one-to-one observation for few days.  Patient has been tolerating diet,.  Discussed with urology,  they recommended outpatient follow-up for bladder cancer  and nephrostomy tubes.  Her hematuria has improved,  her hemoglobin remained stable. Patient was cleared from urology to be discharged and follow-up outpatient.  Discussed with patient's primary care physician and explained about following up on kidney functions.  She has appointment in 2 days with her PCP. Follows with the [facility] program. Will need outpatient oncology evaluation.  She was managed for below problems.  Discharge Diagnoses:  Active Problems:   Acute kidney injury (AKI) with acute tubular necrosis (ATN) (HCC)   Hydronephrosis   Acute on chronic blood loss anemia   Acute kidney injury ([place])   Essential hypertension   AF (paroxysmal atrial fibrillation) (HCC)   Bladder mass   Hyperkalemia   Acquired thrombophilia ([place])   Malignant neoplasm of urinary bladder (HCC)   Goals of care, counseling/discussion   DNR (do not resuscitate)   Palliative care by specialist  1. Acute kidney injury secondary to obstruction with bilateral hydronephrosis: Nephrostomy tubes placed by interventional radiology on 04/15/2020. Creatinine peaked at 6.69. Today's creatinine at 2.92.  hoping for better drop down in creatinine . Will restart IV fluids 50 cc/h. Recheck creatinine tomorrow.  2. 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 The patient understood that she has a cancerous process at this time. Son was concerned about her mental status,  MRI of the brain negative without contrast for metastases. Spoke with Dr. Bernardo Heater urology. Not a surgical candidate but may be a candidate for radiation and recommended outpatient oncology evaluation. Overall prognosis is poor. Patient at this point is a full code. Follow-up with the [facility] program for further goals of care conversations as outpatient.  3. Acute blood loss anemia with hematuria. Xarelto on hold from  admission. Transfused 1 unit of packed red blood cells with good response,   hemoglobin up to 9.3 today. Urine is still pinkish, urology re-consulted.  4. Paroxysmal atrial fibrillation  HR controlled,  on low-dose Toprol.  5. Essential hypertension. Continue lower dose Norvasc, hydralazine and Toprol.  6. Constipation on MiraLAX.  7. Cognitive issues. MRI of the brain without contrast negative for metastases. Did show small vessel ischemic changes.  8. Large hiatal hernia on PPI. 9.  10. Acquired thrombophilia secondary to paroxysmal atrial fibrillation. Anticoagulation contraindicated with bleeding.   Discharge Instructions  Discharge Instructions    Call MD for:  difficulty breathing, headache or visual disturbances   Complete by: As directed    Call MD for:  persistant dizziness or light-headedness   Complete by: As directed    Call MD for:  persistant nausea and vomiting   Complete by: As directed    Call MD for:  redness, tenderness, or signs of infection (pain, swelling, redness, odor or green/yellow discharge around incision site)   Complete by: As directed    Call MD for:  temperature >100.4   Complete by: As directed    Diet - low sodium heart healthy   Complete by: As directed    Diet Carb Modified   Complete by: As directed    Discharge instructions   Complete by: As directed    Advised to follow up PCP in 2-3 days. Advised to follow up with Urology for bladder cancer and nephrostomy tubes, Advised PCP to check CBC and BMP .   Discharge wound care:   Complete by: As directed    Follow up Urology   Increase activity slowly   Complete by: As directed      Allergies as of 04/25/2020      Reactions   Gabapentin Other (See Comments)   Unknown effects      Medication List    STOP taking these medications   meloxicam 7.5 MG tablet Commonly known as: MOBIC   traMADol 50 MG tablet Commonly known as: ULTRAM   Xarelto 2.5 MG Tabs tablet Generic drug: rivaroxaban     TAKE these medications   acetaminophen 650 MG CR  tablet Commonly known as: TYLENOL Take 1,300 mg by mouth in the morning and at bedtime.   amLODipine 5 MG tablet Commonly known as: NORVASC Take 5 mg by mouth daily.   bimatoprost 0.01 % Soln Commonly known as: LUMIGAN Place 1 drop into both eyes at bedtime.   calcium carbonate 500 MG chewable tablet Commonly known as: TUMS - dosed in mg elemental calcium Chew 2 tablets by mouth 3 (three) times daily.   cholecalciferol 25 MCG (1000 UNIT) tablet Commonly known as: VITAMIN D3 Take 1,000 Units by mouth daily.   citalopram 20 MG tablet Commonly known as: CELEXA Take 20 mg by mouth daily.   hydrALAZINE 100 MG tablet Commonly known as: APRESOLINE Take 100 mg by mouth 3 (three) times daily.   lidocaine 5 % Commonly known as: LIDODERM Place 1 patch onto the skin daily. Remove & Discard patch within 12 hours or as directed by MD   losartan 100 MG tablet Commonly known as: COZAAR Take 100 mg by mouth daily.   metoprolol succinate 25 MG 24 hr tablet Commonly known as: TOPROL-XL Take 1 tablet (25 mg total) by mouth daily. Start taking on: April 26, 2020   OPTIVE 0.5-0.9 % ophthalmic solution Generic drug: carboxymethylcellul-glycerin Place 1-2 drops into both eyes 2 (two) times daily  as needed for dry eyes.   oxybutynin 5 MG 24 hr tablet Commonly known as: DITROPAN-XL Take 5 mg by mouth at bedtime.   pantoprazole 40 MG tablet Commonly known as: PROTONIX Take 40 mg by mouth 2 (two) times daily.   pregabalin 75 MG capsule Commonly known as: LYRICA Take 75 mg by mouth at bedtime.   Timolol Maleate 0.5 % (DAILY) Soln 1 gtt in each eye every morning for increased pressure   traZODone 100 MG tablet Commonly known as: DESYREL Take 100 mg by mouth at bedtime.            Discharge Care Instructions  (From admission, onward)         Start     Ordered   04/25/20 0000  Discharge wound care:       Comments: Follow up Urology   04/25/20 1236          Contact  information for follow-up providers    [facility] Follow up in 3 day(s).   Contact information: [address]  [place] [address] [phone]        Abbie Sons, MD Follow up in 1 week(s).   Specialty: Urology Contact information: [address] [place] [phone]            Contact information for after-discharge care    Destination    [facility] .   Service: Skilled Nursing Contact information: [address] [place] [phone]                 Allergies  Allergen Reactions  . Gabapentin Other (See Comments)    Unknown effects    Consultations:  Urology    Procedures/Studies: MR BRAIN WO CONTRAST  Addendum Date: 04/19/2020   ADDENDUM REPORT: 04/19/2020 14:20 ADDENDUM: History: Atrial fibrillation. Confusion. Metastatic disease evaluation. Brain: The study suffers from some motion degradation. Diffusion imaging does not show any acute or subacute infarction or other cause of restricted diffusion. No focal abnormality affects the brainstem or cerebellum. Cerebral hemispheres show moderate to marked chronic small-vessel ischemic changes of the white matter. No cortical or large vessel territory infarction. No evidence of mass lesion, hemorrhage, hydrocephalus or extra-axial collection. Vascular: Major vessels at the base of the brain show flow. Skull and upper cervical spine: Normal. Sinuses/Orbits: Clear/normal. Other: None significant. IMPRESSION: IMPRESSION No acute or subacute finding. Moderate to marked chronic small-vessel ischemic changes affecting the cerebral hemispheric white matter. No sign of mass lesion. Electronically Signed   By: Nelson Chimes M.D.   On: 04/19/2020 14:20   Result Date: 04/19/2020 EXAM: MRI HEAD WITHOUT CONTRAST TECHNIQUE: Multiplanar, multiecho pulse sequences of the brain and surrounding structures were obtained  without intravenous contrast. COMPARISON:  None. FINDINGS: Brain: Vascular: Skull and upper cervical spine: Sinuses/Orbits: Other: Electronically Signed: By: Nelson Chimes M.D. On: 04/19/2020 14:04   DG Chest Port 1 View  Result Date: 04/13/2020 CLINICAL DATA:  Acute renal failure EXAM: PORTABLE CHEST 1 VIEW COMPARISON:  Radiograph 10/28/2013 (report only) FINDINGS: Hyperinflation of the lungs, reportedly chronic. No focal consolidative opacity is seen. No convincing features of edema. No pneumothorax or visible effusion. Large hiatal hernia is present. Dense material along the right mediastinal border may reflect material within the esophagus or calcified hilar nodes. Remaining cardiomediastinal contours are unremarkable accounting for positioning secondary to marked levocurvature of the lumbar spine with severe multilevel discogenic changes and additional degenerative changes in  the shoulders. IMPRESSION: 1. No acute cardiopulmonary disease. 2. Large hiatal hernia. Density along the right mediastinal border may reflect material within the esophagus or calcified hilar nodes. 3. Hyperinflation, likely emphysematous change. Electronically Signed   By: Lovena Le M.D.   On: 04/13/2020 02:24   DG OR UROLOGY CYSTO IMAGE ([place])  Result Date: 04/15/2020 INDICATION: Bilateral hydronephrosis secondary to distal ureteral obstruction due to bladder tumor. Associated renal failure. EXAM: BILATERAL PERCUTANEOUS NEPHROSTOMY TUBE PLACEMENT COMPARISON:  CT of the abdomen and pelvis without contrast on 04/13/2020 MEDICATIONS: 2 g IV Ancef; The antibiotic was administered in an appropriate time frame prior to skin puncture. ANESTHESIA/SEDATION: The patient was not given conscious sedation due to relatively up tended state. CONTRAST:  25 mL Visipaque 320-administered into the collecting system(s) FLUOROSCOPY TIME:  Fluoroscopy Time: 4 minutes and 30 seconds. 31.5 mGy. COMPLICATIONS: None immediate. PROCEDURE: Informed  written consent was obtained from the patient after a thorough discussion of the procedural risks, benefits and alternatives. All questions were addressed. Maximal Sterile Barrier Technique was utilized including caps, mask, sterile gowns, sterile gloves, sterile drape, hand hygiene and skin antiseptic. A timeout was performed prior to the initiation of the procedure. Ultrasound was used to localize the kidneys. Under direct ultrasound guidance, both renal collecting systems were punctured with a 21 gauge needles and accessed with transitional dilators. Over guidewires, access was dilated to 10 [place] and 10 [place] percutaneous nephrostomy tubes placed. Catheter position was confirmed by fluoroscopy after contrast injection. Both catheters were attached to gravity drainage bags and secured at the skin with Prolene retention sutures and StatLock devices. FINDINGS: Both kidneys demonstrate severe hydronephrosis. Catheters were formed at the level of the renal pelvis bilaterally and are draining urine. The left-sided catheter does extend partially into the proximal ureter. IMPRESSION: Placement of bilateral percutaneous nephrostomy tubes. Bilateral 10 French catheters were attached to gravity bag drainage. Electronically Signed   By: Aletta Edouard M.D.   On: 04/15/2020 16:28   CT Renal Stone Study  Result Date: 04/13/2020 CLINICAL DATA:  Acute renal failure. EXAM: CT ABDOMEN AND PELVIS WITHOUT CONTRAST TECHNIQUE: Multidetector CT imaging of the abdomen and pelvis was performed following the standard protocol without IV contrast. COMPARISON:  None. FINDINGS: Lower chest: No consolidation or pleural fluid. Moderate to large hiatal hernia with greater than 50% of the stomach intrathoracic. Hepatobiliary: Focal hepatic abnormality on noncontrast exam. Gallbladder minimally distended. No calcified gallstone. No biliary dilatation. Pancreas: Mild parenchymal atrophy. No ductal dilatation or inflammation. Spleen: Normal  in size without focal abnormality. Adrenals/Urinary Tract: No adrenal nodule. There is marked bilateral hydroureteronephrosis. Thinning of bilateral renal parenchyma. No significant perinephric edema. Both ureters are dilated to the urinary bladder insertion. Bladder is essentially empty with equivocal circumferential wall thickening. No renal calculi. Stomach/Bowel: Moderately large hiatal hernia with greater than 50% of the stomach intrathoracic. No evidence of small bowel obstruction or inflammation. The appendix is not definitively visualized. Moderate volume of stool throughout the colon. There is colonic redundancy and tortuosity. Distal descending and sigmoid colon are nondistended, however there is stool distending the rectum with rectal distention of 6.6 cm. No rectal or colonic wall thickening. Vascular/Lymphatic: Aortic atherosclerosis and tortuosity. No aortic aneurysm. Limited assessment for adenopathy in the absence of contrast and paucity of intra-abdominal fat. No bulky enlarged abdominopelvic lymph nodes. Reproductive: The uterus is not visualized, may be surgically absent or atrophic. No evidence of adnexal mass. Other: Few scattered calcifications in the pelvis may be phleboliths or calcified  nodes. There is no ascites. No free air. Musculoskeletal: Degenerative change of the pubic symphysis. Scoliosis and multilevel degenerative change in the spine. IMPRESSION: 1. Marked bilateral hydroureteronephrosis. Both ureters are dilated to the urinary bladder insertion. There is thinning of the bilateral renal parenchyma. Lack of perinephric edema suggests this may be a chronic process. 2. Decompressed bladder with equivocal circumferential wall thickening. 3. Moderate to large hiatal hernia with greater than 50% of the stomach intrathoracic. 4. Moderate volume of stool throughout the colon with colonic redundancy and tortuosity, suggesting constipation. Rectal distention of 6.6 cm, recommend correlation  for fecal impaction. Aortic Atherosclerosis (ICD10-I70.0). Electronically Signed   By: Keith Rake M.D.   On: 04/13/2020 03:42   IR NEPHROSTOMY PLACEMENT LEFT  Result Date: 04/15/2020 INDICATION: Bilateral hydronephrosis secondary to distal ureteral obstruction due to bladder tumor. Associated renal failure. EXAM: BILATERAL PERCUTANEOUS NEPHROSTOMY TUBE PLACEMENT COMPARISON:  CT of the abdomen and pelvis without contrast on 04/13/2020 MEDICATIONS: 2 g IV Ancef; The antibiotic was administered in an appropriate time frame prior to skin puncture. ANESTHESIA/SEDATION: The patient was not given conscious sedation due to relatively up tended state. CONTRAST:  25 mL Visipaque 320-administered into the collecting system(s) FLUOROSCOPY TIME:  Fluoroscopy Time: 4 minutes and 30 seconds. 31.5 mGy. COMPLICATIONS: None immediate. PROCEDURE: Informed written consent was obtained from the patient after a thorough discussion of the procedural risks, benefits and alternatives. All questions were addressed. Maximal Sterile Barrier Technique was utilized including caps, mask, sterile gowns, sterile gloves, sterile drape, hand hygiene and skin antiseptic. A timeout was performed prior to the initiation of the procedure. Ultrasound was used to localize the kidneys. Under direct ultrasound guidance, both renal collecting systems were punctured with a 21 gauge needles and accessed with transitional dilators. Over guidewires, access was dilated to 10 [place] and 10 [place] percutaneous nephrostomy tubes placed. Catheter position was confirmed by fluoroscopy after contrast injection. Both catheters were attached to gravity drainage bags and secured at the skin with Prolene retention sutures and StatLock devices. FINDINGS: Both kidneys demonstrate severe hydronephrosis. Catheters were formed at the level of the renal pelvis bilaterally and are draining urine. The left-sided catheter does extend partially into the proximal ureter.  IMPRESSION: Placement of bilateral percutaneous nephrostomy tubes. Bilateral 10 French catheters were attached to gravity bag drainage. Electronically Signed   By: Aletta Edouard M.D.   On: 04/15/2020 16:28   IR NEPHROSTOMY PLACEMENT RIGHT  Result Date: 04/15/2020 INDICATION: Bilateral hydronephrosis secondary to distal ureteral obstruction due to bladder tumor. Associated renal failure. EXAM: BILATERAL PERCUTANEOUS NEPHROSTOMY TUBE PLACEMENT COMPARISON:  CT of the abdomen and pelvis without contrast on 04/13/2020 MEDICATIONS: 2 g IV Ancef; The antibiotic was administered in an appropriate time frame prior to skin puncture. ANESTHESIA/SEDATION: The patient was not given conscious sedation due to relatively up tended state. CONTRAST:  25 mL Visipaque 320-administered into the collecting system(s) FLUOROSCOPY TIME:  Fluoroscopy Time: 4 minutes and 30 seconds. 31.5 mGy. COMPLICATIONS: None immediate. PROCEDURE: Informed written consent was obtained from the patient after a thorough discussion of the procedural risks, benefits and alternatives. All questions were addressed. Maximal Sterile Barrier Technique was utilized including caps, mask, sterile gowns, sterile gloves, sterile drape, hand hygiene and skin antiseptic. A timeout was performed prior to the initiation of the procedure. Ultrasound was used to localize the kidneys. Under direct ultrasound guidance, both renal collecting systems were punctured with a 21 gauge needles and accessed with transitional dilators. Over guidewires, access was dilated to 10 [place]  and 10 French percutaneous nephrostomy tubes placed. Catheter position was confirmed by fluoroscopy after contrast injection. Both catheters were attached to gravity drainage bags and secured at the skin with Prolene retention sutures and StatLock devices. FINDINGS: Both kidneys demonstrate severe hydronephrosis. Catheters were formed at the level of the renal pelvis bilaterally and are draining  urine. The left-sided catheter does extend partially into the proximal ureter. IMPRESSION: Placement of bilateral percutaneous nephrostomy tubes. Bilateral 10 French catheters were attached to gravity bag drainage. Electronically Signed   By: Aletta Edouard M.D.   On: 04/15/2020 16:28    Bilateral nephrostomy tubes   Subjective: Patient was seen and examined at bedside. No overnight events. Patient seems very pleasant, denies any concerns.  She has nephrostomy tubes, has some pinkish urine,  denies any other concerns.  Hemoglobin remains a stable. Patient happy to be discharged today.  Discharge Exam: Vitals:   04/25/20 0954 04/25/20 1211  BP: 114/88 (!) 159/69  Pulse:  (!) 56  Resp:  17  Temp:  98 F (36.7 C)  SpO2:  97%   Vitals:   04/25/20 0419 04/25/20 0740 04/25/20 0954 04/25/20 1211  BP: (!) 151/78 (!) 141/79 114/88 (!) 159/69  Pulse: 76 79  (!) 56  Resp: 16 18  17   Temp: 98.4 F (36.9 C) 98.2 F (36.8 C)  98 F (36.7 C)  TempSrc: Oral Oral    SpO2: 98% 98%  97%  Weight:      Height:        General: Pt is alert, awake, not in acute distress Cardiovascular: RRR, S1/S2 +, no rubs, no gallops Respiratory: CTA bilaterally, no wheezing, no rhonchi Abdominal: Soft, NT, ND, bowel sounds + Extremities: no edema, no cyanosis    The results of significant diagnostics from this hospitalization (including imaging, microbiology, ancillary and laboratory) are listed below for reference.     Microbiology: No results found for this or any previous visit (from the past 240 hour(s)).   Labs: BNP (last 3 results) No results for input(s): BNP in the last 8760 hours. Basic Metabolic Panel: Recent Labs  Lab 04/19/20 0541 04/20/20 0451 04/21/20 0552 04/22/20 0723 04/23/20 0631 04/24/20 0543 04/25/20 0438  NA 141   < > 141 141 139 139 139  K 3.6   < > 3.4* 3.5 3.5 3.5 3.4*  CL 106   < > 107 111 110 106 108  CO2 21*   < > 20* 24 19* 23 25  GLUCOSE 97   < > 86 96 91 84  95  BUN 55*   < > 50* 40* 36* 34* 30*  CREATININE 5.54*   < > 4.05* 3.64* 3.34* 3.17* 2.92*  CALCIUM 9.0   < > 8.8* 8.6* 8.3* 8.3* 8.4*  MG 1.9  --  1.9  --  1.7 1.6* 2.3  PHOS  --   --  3.7  --  3.1 3.4  --    < > = values in this interval not displayed.   Liver Function Tests: No results for input(s): AST, ALT, ALKPHOS, BILITOT, PROT, ALBUMIN in the last 168 hours. No results for input(s): LIPASE, AMYLASE in the last 168 hours. No results for input(s): AMMONIA in the last 168 hours. CBC: Recent Labs  Lab 04/21/20 0552 04/22/20 0723 04/23/20 0631 04/24/20 0543 04/25/20 0438  WBC 9.2 7.9 7.8  --  8.7  HGB 9.8* 9.0* 9.3* 8.7* 8.8*  HCT 28.3* 27.8* 29.8* 27.9* 27.3*  MCV 95.9 100.0 101.4*  --  100.7*  PLT 281 265 284  --  312   Cardiac Enzymes: No results for input(s): CKTOTAL, CKMB, CKMBINDEX, TROPONINI in the last 168 hours. BNP: Invalid input(s): POCBNP CBG: No results for input(s): GLUCAP in the last 168 hours. D-Dimer No results for input(s): DDIMER in the last 72 hours. Hgb A1c No results for input(s): HGBA1C in the last 72 hours. Lipid Profile No results for input(s): CHOL, HDL, LDLCALC, TRIG, CHOLHDL, LDLDIRECT in the last 72 hours. Thyroid function studies No results for input(s): TSH, T4TOTAL, T3FREE, THYROIDAB in the last 72 hours.  Invalid input(s): FREET3 Anemia work up No results for input(s): VITAMINB12, FOLATE, FERRITIN, TIBC, IRON, RETICCTPCT in the last 72 hours. Urinalysis    Component Value Date/Time   COLORURINE YELLOW (A) 04/13/2020 0223   APPEARANCEUR CLEAR (A) 04/13/2020 0223   LABSPEC 1.009 04/13/2020 0223   PHURINE 6.0 04/13/2020 0223   GLUCOSEU NEGATIVE 04/13/2020 0223   HGBUR MODERATE (A) 04/13/2020 0223   BILIRUBINUR NEGATIVE 04/13/2020 0223   KETONESUR NEGATIVE 04/13/2020 0223   PROTEINUR 30 (A) 04/13/2020 0223   NITRITE NEGATIVE 04/13/2020 0223   LEUKOCYTESUR TRACE (A) 04/13/2020 0223   Sepsis Labs Invalid input(s):  PROCALCITONIN,  WBC,  LACTICIDVEN Microbiology No results found for this or any previous visit (from the past 240 hour(s)).   Time coordinating discharge: Over 30 minutes  SIGNED:   Shawna Clamp, MD  [facility] 04/25/2020, 12:40 PM Pager   If 7PM-7AM, please contact night-coverage [email or web address]

## 2020-04-25 NOTE — Progress Notes (Signed)
Report given to Brewing technologist at [facility]. Discharge packet put in with patient belongings. IV taken out. Son Wille Glaser updated over the phone. Patient being discharged via [facility].

## 2020-04-25 NOTE — Plan of Care (Signed)

## 2020-04-25 NOTE — Care Management Important Message (Signed)
Important Message  Patient Details  Name: Christy Mccann MRN: [id number] Date of Birth: 23-Oct-1930   Medicare Important Message Given:  Yes     Dannette Barbara 04/25/2020, 1:37 PM

## 2020-04-27 ENCOUNTER — Telehealth: Payer: Self-pay

## 2020-04-27 NOTE — Telephone Encounter (Signed)
Laureen Ochs, NP from [facility] called (call back numbers [phone]) wanting to know what patient's follow up would be if she does not go ahead with treatment in regards to her nephrostomy tubes? She did read the hospital note if she has treatment about internalizing the tubes but family is leaning towards no treatment and would like to know what needs to be done going forward with her tubes. Please advise

## 2020-04-28 NOTE — Telephone Encounter (Signed)
Notified NP Rolena Infante  as instructed. She would like to know if there is an option to internalize the tube even with out treatment? For patient quality of life she wonders if the internalization of the tubes would be best. What are the risks involved with internalizing the stents vs external? She states she can be reached directly to discuss further at [phone]

## 2020-04-28 NOTE — Telephone Encounter (Signed)
Would need to have nephrostomy tubes changed in 4-6 weeks in interventional radiology then every 6 weeks

## 2020-04-29 NOTE — Telephone Encounter (Signed)
If the neph tubes are internalized to stents and she doesn't have treatment they will likely obstruct secondary to further tumor growth. If that occurred she would have progressive renal failure and would have to decide on replacing neph tubes or no treatment. If she has any questions I can call her when I get back in town.

## 2020-04-29 NOTE — Telephone Encounter (Signed)
LMOM for Christy Mccann to return call.

## 2020-05-11 ENCOUNTER — Other Ambulatory Visit: Payer: Self-pay | Admitting: Family

## 2020-05-11 DIAGNOSIS — N1330 Unspecified hydronephrosis: Secondary | ICD-10-CM

## 2020-05-11 DIAGNOSIS — C679 Malignant neoplasm of bladder, unspecified: Secondary | ICD-10-CM

## 2020-05-18 ENCOUNTER — Inpatient Hospital Stay: Payer: PRIVATE HEALTH INSURANCE | Admitting: Internal Medicine

## 2020-05-18 ENCOUNTER — Inpatient Hospital Stay: Payer: PRIVATE HEALTH INSURANCE

## 2020-05-20 ENCOUNTER — Ambulatory Visit
Admission: RE | Admit: 2020-05-20 | Discharge: 2020-05-20 | Disposition: A | Discharge status: Home / Self Care | Payer: Medicare (Managed Care) | Source: Ambulatory Visit | Attending: Family | Admitting: Family

## 2020-05-20 ENCOUNTER — Other Ambulatory Visit: Payer: Self-pay

## 2020-05-20 DIAGNOSIS — N1330 Unspecified hydronephrosis: Secondary | ICD-10-CM | POA: Diagnosis not present

## 2020-05-20 DIAGNOSIS — Z436 Encounter for attention to other artificial openings of urinary tract: Secondary | ICD-10-CM | POA: Diagnosis not present

## 2020-05-20 DIAGNOSIS — C679 Malignant neoplasm of bladder, unspecified: Secondary | ICD-10-CM | POA: Insufficient documentation

## 2020-05-20 HISTORY — PX: IR NEPHROSTOMY EXCHANGE RIGHT: IMG6070

## 2020-05-20 HISTORY — PX: IR NEPHROSTOMY EXCHANGE LEFT: IMG6069

## 2020-05-20 MED ORDER — IODIXANOL 320 MG/ML IV SOLN: 50.0000 mL | Freq: Once | INTRAVENOUS | Status: DC | PRN

## 2020-05-20 NOTE — Progress Notes (Signed)
Received pt. Back into recovery. Pt. Awake, oriented to self and situation. Bilat. Nephrostomy tubes intact, patent: Left neph. Tube draining pink tinged clear Urine. Right neph. Tube draining clear yellow urine. Pt. Denies any subjective c/o at present. Denies nausea, dizziness, flank pain. No acute distress at present. Called and spoke with pt. Son Broadus John; he also spoke with pt. Via telephone.

## 2020-05-20 NOTE — Procedures (Signed)
Interventional Radiology Procedure:   Indications: Bladder cancer and bilateral nephrostomy tubes.  Needs routine tube exchange.   Procedure: Bilateral nephrostomy tube exchange  Findings: 10 Fr drain in renal pelvis bilaterally  Complications: None     EBL: None  Plan: Plan for routine exchange in future.    Lynlee Stratton R. Anselm Pancoast, MD  Pager: [phone]

## 2020-06-02 ENCOUNTER — Other Ambulatory Visit: Payer: Self-pay | Admitting: Radiology

## 2020-06-02 DIAGNOSIS — N1330 Unspecified hydronephrosis: Secondary | ICD-10-CM

## 2020-06-17 ENCOUNTER — Other Ambulatory Visit: Payer: Self-pay | Admitting: Internal Medicine

## 2020-06-17 ENCOUNTER — Other Ambulatory Visit: Payer: Self-pay | Admitting: Radiology

## 2020-06-17 ENCOUNTER — Other Ambulatory Visit: Payer: Self-pay

## 2020-06-17 ENCOUNTER — Ambulatory Visit
Admission: RE | Admit: 2020-06-17 | Discharge: 2020-06-17 | Disposition: A | Discharge status: Home / Self Care | Payer: Medicare (Managed Care) | Source: Ambulatory Visit | Attending: Urology | Admitting: Urology

## 2020-06-17 DIAGNOSIS — N1330 Unspecified hydronephrosis: Secondary | ICD-10-CM

## 2020-06-17 DIAGNOSIS — Z436 Encounter for attention to other artificial openings of urinary tract: Secondary | ICD-10-CM | POA: Diagnosis not present

## 2020-06-17 HISTORY — PX: IR NEPHROSTOMY EXCHANGE RIGHT: IMG6070

## 2020-06-17 HISTORY — PX: IR NEPHROSTOMY EXCHANGE LEFT: IMG6069

## 2020-06-17 MED ORDER — IODIXANOL 320 MG/ML IV SOLN
50.0000 mL | Freq: Once | INTRAVENOUS | Status: AC | PRN
  Administered 2020-06-17: 11:00:00 20 mL

## 2020-06-17 NOTE — Procedures (Signed)
Pre Procedure Dx: Hydronephrosis Post Procedure Dx: Same  Successful bilateral PCN exchange.    EBL: None   No immediate complications.   Jay Jyll Tomaro, MD Pager #: [phone]  

## 2020-06-17 NOTE — Progress Notes (Signed)
Dr. Pascal Lux spoke at length with son:  Remo Lipps regarding procedure and follow up. Son Broadus John has POA and Dr. Ronny Bacon will call and discuss today's procedure and follow up care.   Patient discharged, reviewed discharge with son, will f/u with nursing facility.

## 2020-06-17 NOTE — Progress Notes (Signed)
Patient arrived via access , son with patient for signing of consent.  Patient awake to name, pain bil. Back area r/t nep tubes.  Vitals stable for patient.

## 2020-06-24 ENCOUNTER — Ambulatory Visit: Payer: PRIVATE HEALTH INSURANCE

## 2020-06-27 DEATH — deceased

## 2020-07-06 ENCOUNTER — Other Ambulatory Visit: Payer: Self-pay | Admitting: Radiology

## 2020-07-06 DIAGNOSIS — N1330 Unspecified hydronephrosis: Secondary | ICD-10-CM

## 2020-07-29 ENCOUNTER — Ambulatory Visit: Payer: PRIVATE HEALTH INSURANCE
